# Patient Record
Sex: Male | Born: 1970 | Race: Black or African American | Hispanic: No | Marital: Married | Smoking: Never smoker
Health system: Southern US, Community
[De-identification: ages and names within clinical notes are randomized; demographics above are authoritative.]

## PROBLEM LIST (undated history)

## (undated) DIAGNOSIS — I10 Essential (primary) hypertension: Secondary | ICD-10-CM

## (undated) HISTORY — PX: NO PAST SURGERIES: SHX2092

---

## 2001-05-08 ENCOUNTER — Emergency Department (HOSPITAL_COMMUNITY): Admission: EM | Admit: 2001-05-08 | Discharge: 2001-05-08 | Payer: Self-pay | Admitting: Internal Medicine

## 2001-05-08 ENCOUNTER — Encounter: Payer: Self-pay | Admitting: Internal Medicine

## 2003-09-07 ENCOUNTER — Emergency Department (HOSPITAL_COMMUNITY): Admission: EM | Admit: 2003-09-07 | Discharge: 2003-09-07 | Payer: Self-pay | Admitting: Emergency Medicine

## 2004-02-27 ENCOUNTER — Emergency Department (HOSPITAL_COMMUNITY): Admission: EM | Admit: 2004-02-27 | Discharge: 2004-02-27 | Payer: Self-pay | Admitting: Emergency Medicine

## 2005-07-10 ENCOUNTER — Emergency Department (HOSPITAL_COMMUNITY): Admission: EM | Admit: 2005-07-10 | Discharge: 2005-07-10 | Payer: Self-pay | Admitting: Emergency Medicine

## 2006-01-04 ENCOUNTER — Inpatient Hospital Stay (HOSPITAL_COMMUNITY): Admission: EM | Admit: 2006-01-04 | Discharge: 2006-01-04 | Payer: Self-pay | Admitting: Emergency Medicine

## 2006-08-11 ENCOUNTER — Emergency Department (HOSPITAL_COMMUNITY): Admission: EM | Admit: 2006-08-11 | Discharge: 2006-08-11 | Payer: Self-pay | Admitting: Emergency Medicine

## 2006-12-28 IMAGING — CR DG CHEST 2V
2 series · 2 of 2 positions shown · non-contrast
Comparison: none

CLINICAL DATA: Chest pain.  Lightheaded.  
 CHEST - 2 VIEW:

[view not recorded (1 of 2)]
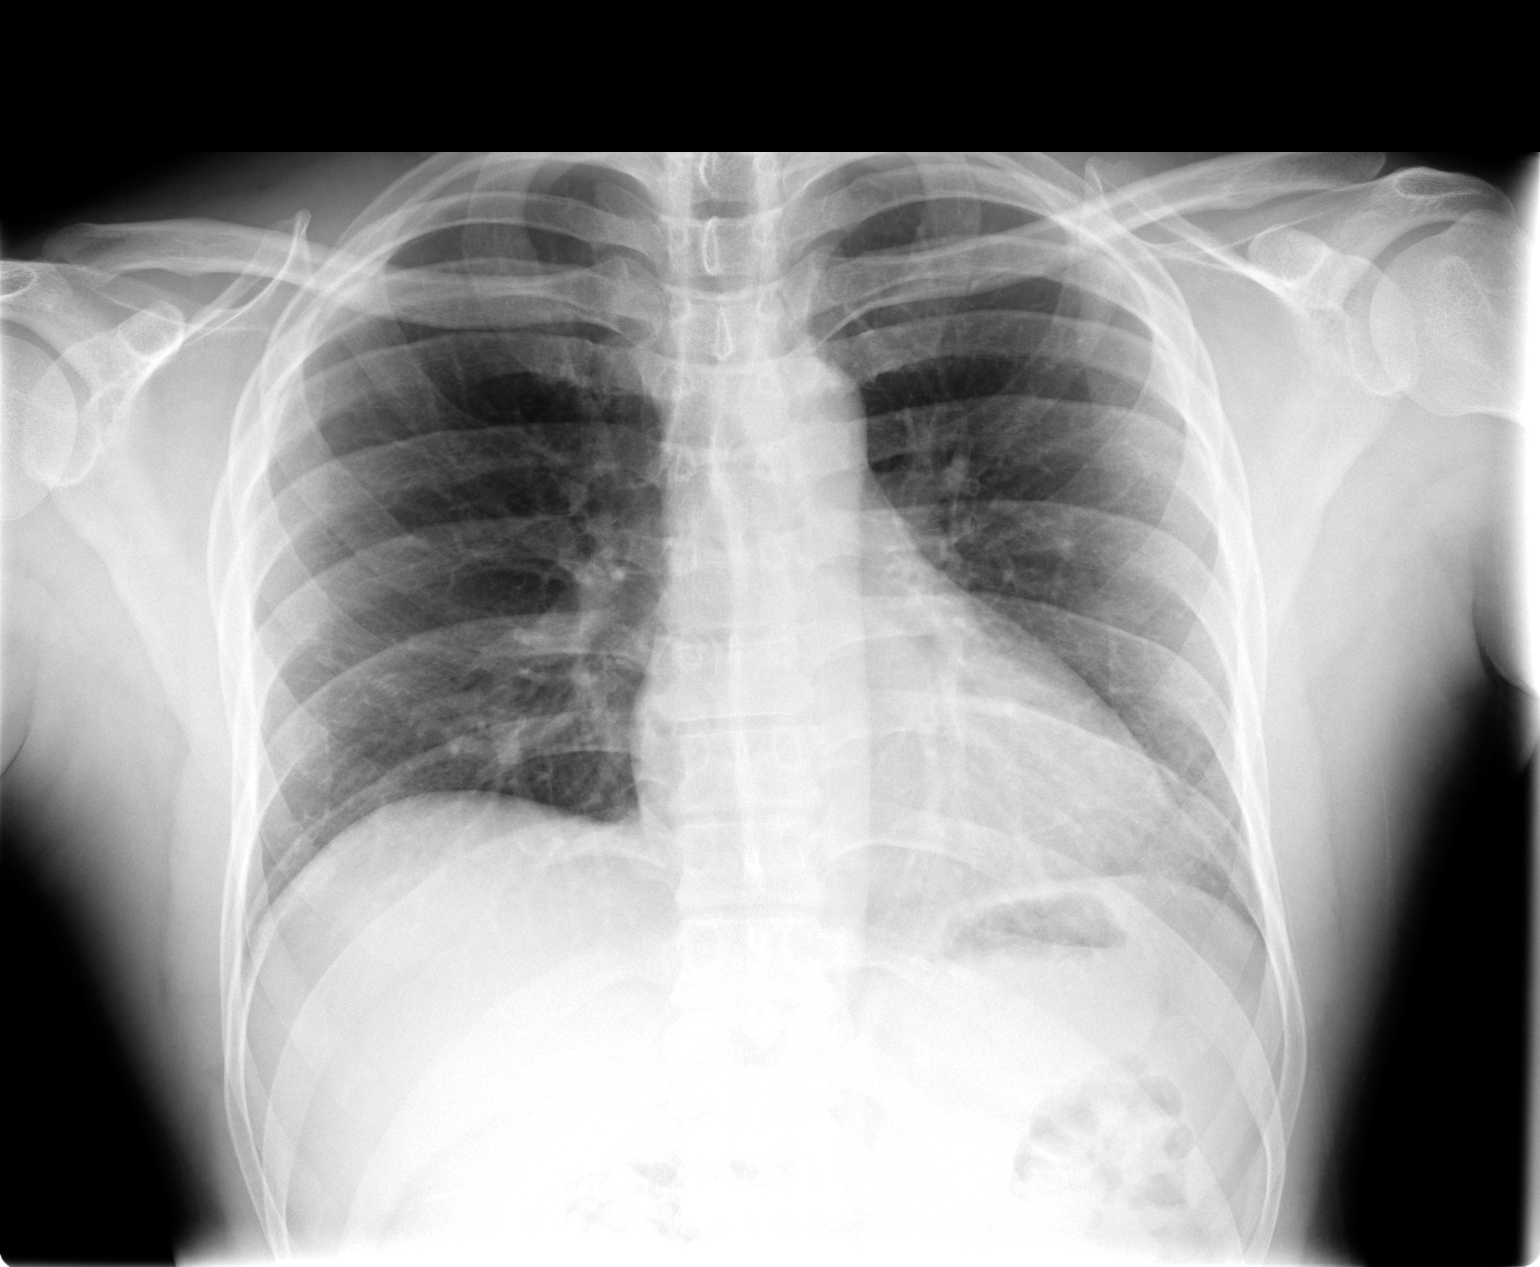

[view not recorded (2 of 2)]
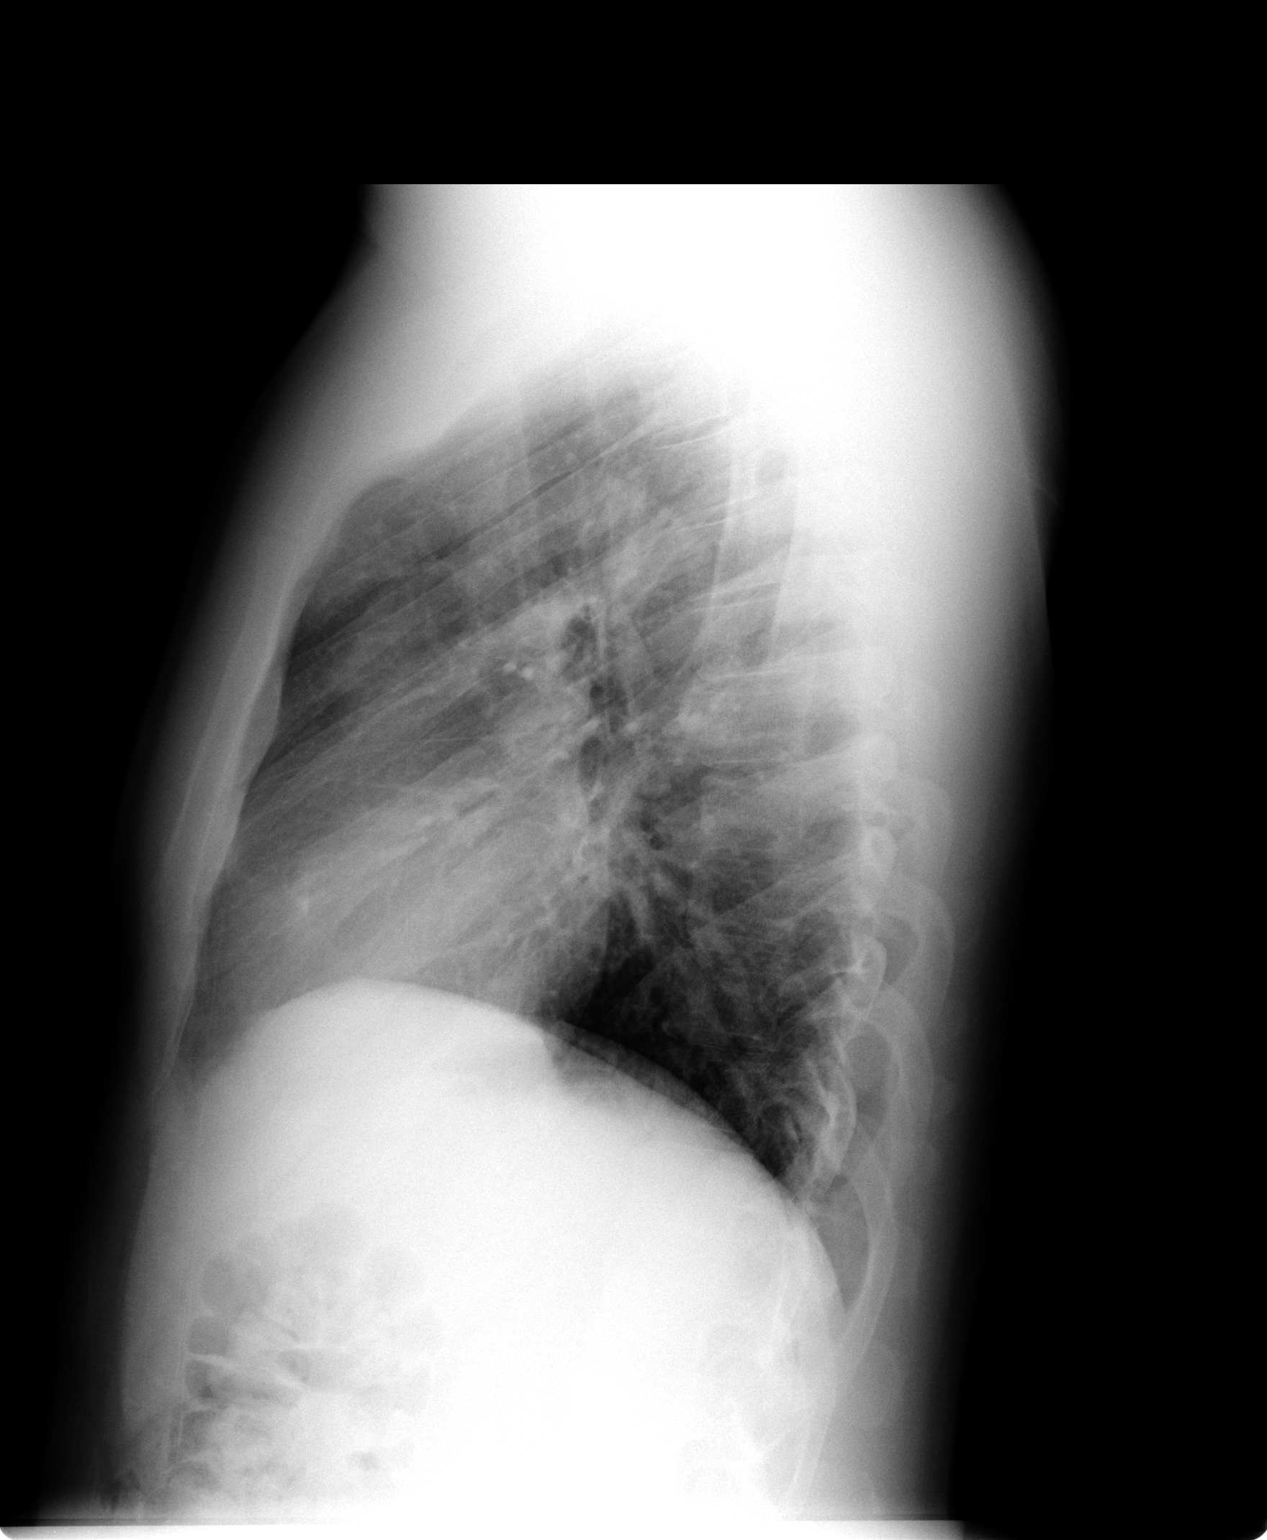

[2 of 2 positions shown; findings below may reference images not displayed]

FINDINGS: PA and lateral views of the chest are made and are compared to the previous studies of 02/27/04 and show mild diffuse peribronchial thickening.  There is no evidence of active infiltrate or edema.  The heart, mediastinum, and bony thorax appear normal and unchanged.
IMPRESSION: Diffuse peribronchial thickening.  Mild basilar atelectasis.  No cardiomegaly, edema, effusion, or pneumothorax.

## 2007-09-10 ENCOUNTER — Emergency Department (HOSPITAL_COMMUNITY): Admission: EM | Admit: 2007-09-10 | Discharge: 2007-09-10 | Payer: Self-pay | Admitting: Emergency Medicine

## 2007-09-14 ENCOUNTER — Emergency Department (HOSPITAL_COMMUNITY): Admission: EM | Admit: 2007-09-14 | Discharge: 2007-09-14 | Payer: Self-pay | Admitting: Emergency Medicine

## 2007-10-29 ENCOUNTER — Emergency Department (HOSPITAL_COMMUNITY): Admission: EM | Admit: 2007-10-29 | Discharge: 2007-10-29 | Payer: Self-pay | Admitting: Emergency Medicine

## 2008-02-06 ENCOUNTER — Emergency Department (HOSPITAL_COMMUNITY): Admission: EM | Admit: 2008-02-06 | Discharge: 2008-02-06 | Payer: Self-pay | Admitting: Emergency Medicine

## 2008-02-06 ENCOUNTER — Encounter: Payer: Self-pay | Admitting: Orthopedic Surgery

## 2008-02-15 ENCOUNTER — Encounter: Payer: Self-pay | Admitting: Orthopedic Surgery

## 2008-02-16 ENCOUNTER — Ambulatory Visit: Payer: Self-pay | Admitting: Orthopedic Surgery

## 2008-02-16 DIAGNOSIS — M76899 Other specified enthesopathies of unspecified lower limb, excluding foot: Secondary | ICD-10-CM | POA: Insufficient documentation

## 2008-07-03 ENCOUNTER — Emergency Department (HOSPITAL_COMMUNITY): Admission: EM | Admit: 2008-07-03 | Discharge: 2008-07-03 | Payer: Self-pay | Admitting: Emergency Medicine

## 2009-07-26 IMAGING — CR DG KNEE COMPLETE 4+V*R*
4 series · 4 of 4 positions shown · non-contrast
Comparison: None

CLINICAL DATA: Pain and swelling

RIGHT KNEE - COMPLETE 4+ VIEW

[view not recorded (1 of 4)]
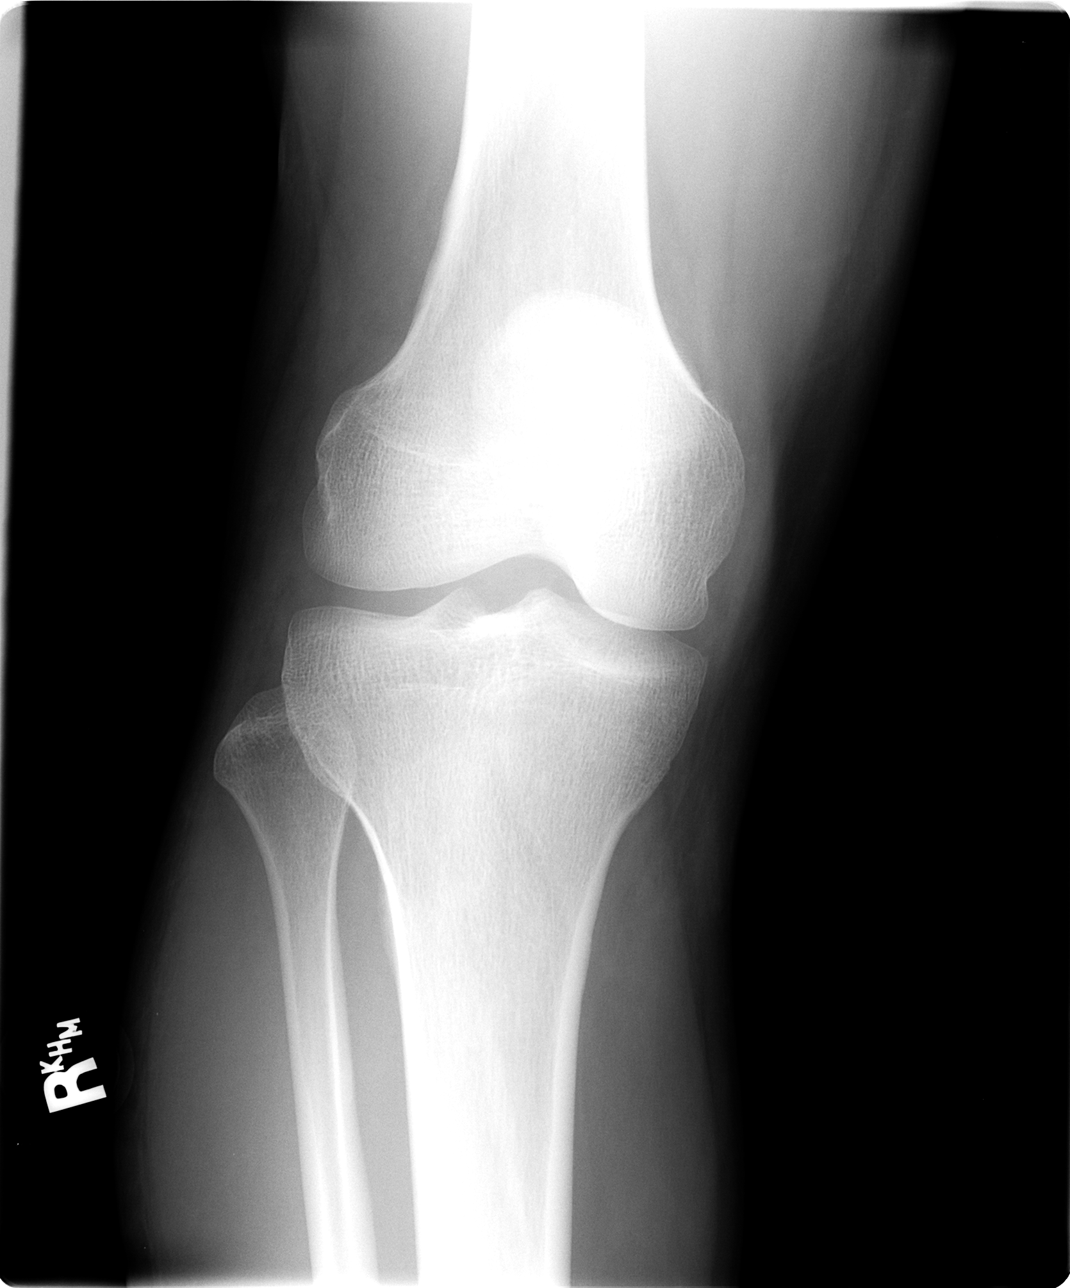

[view not recorded (2 of 4)]
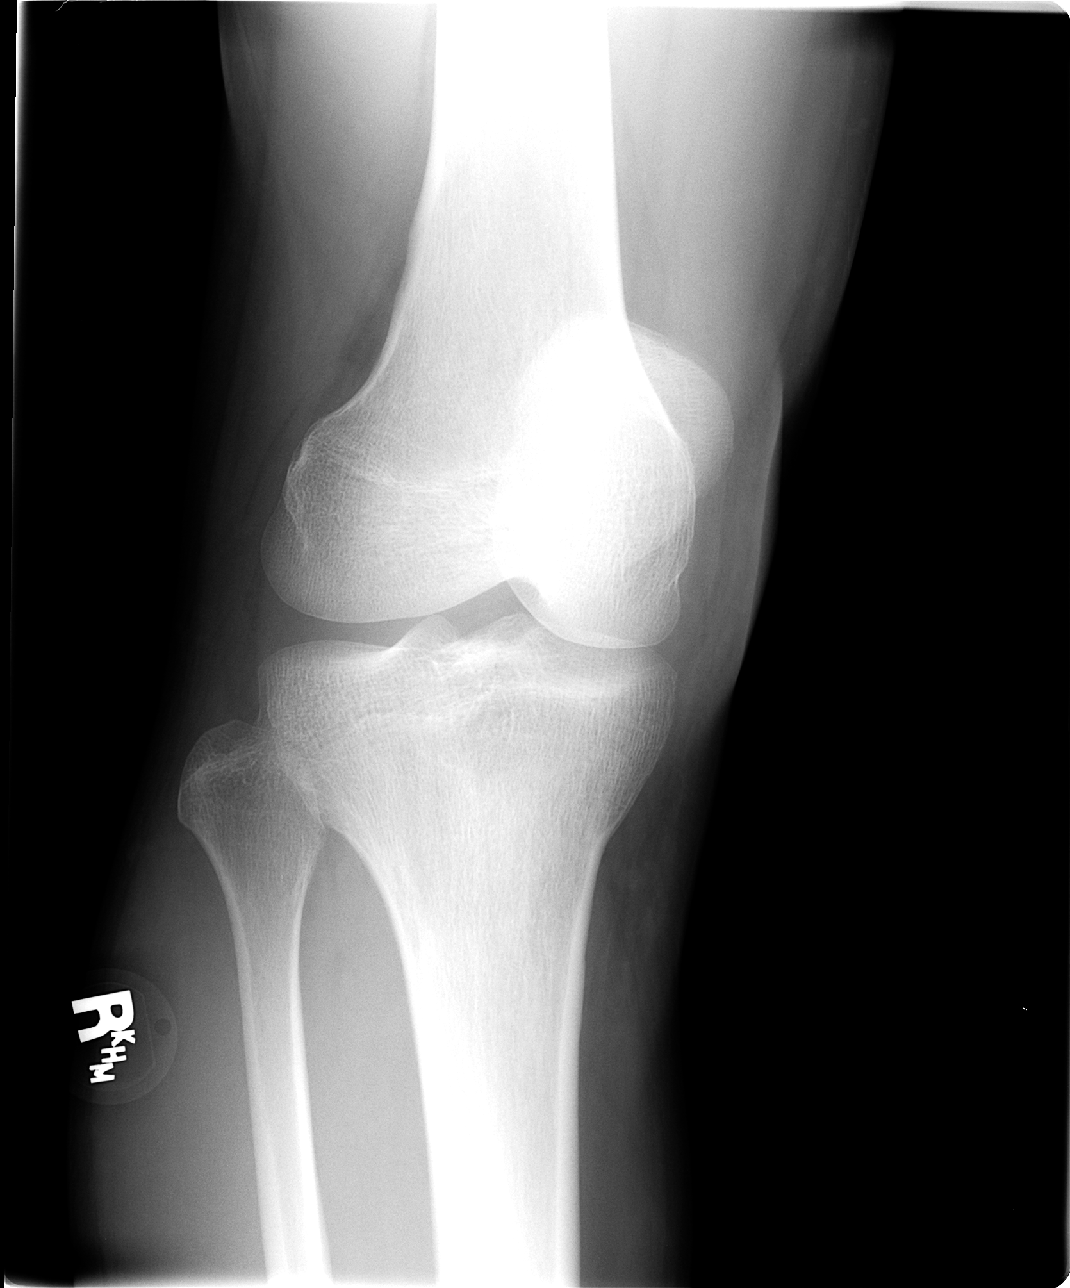

[view not recorded (3 of 4)]
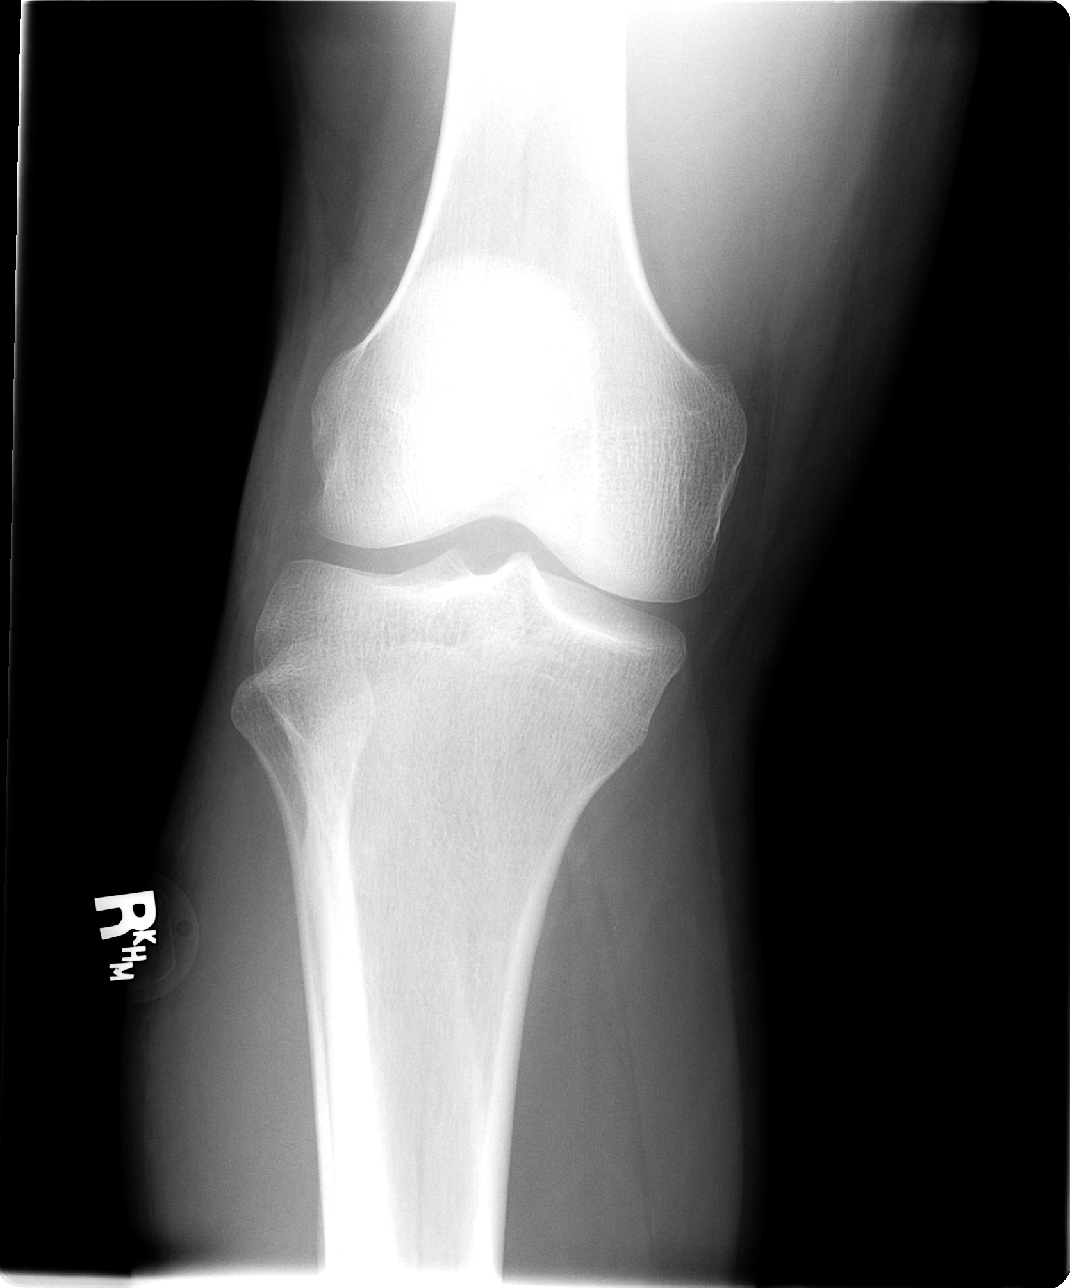

[view not recorded (4 of 4)]
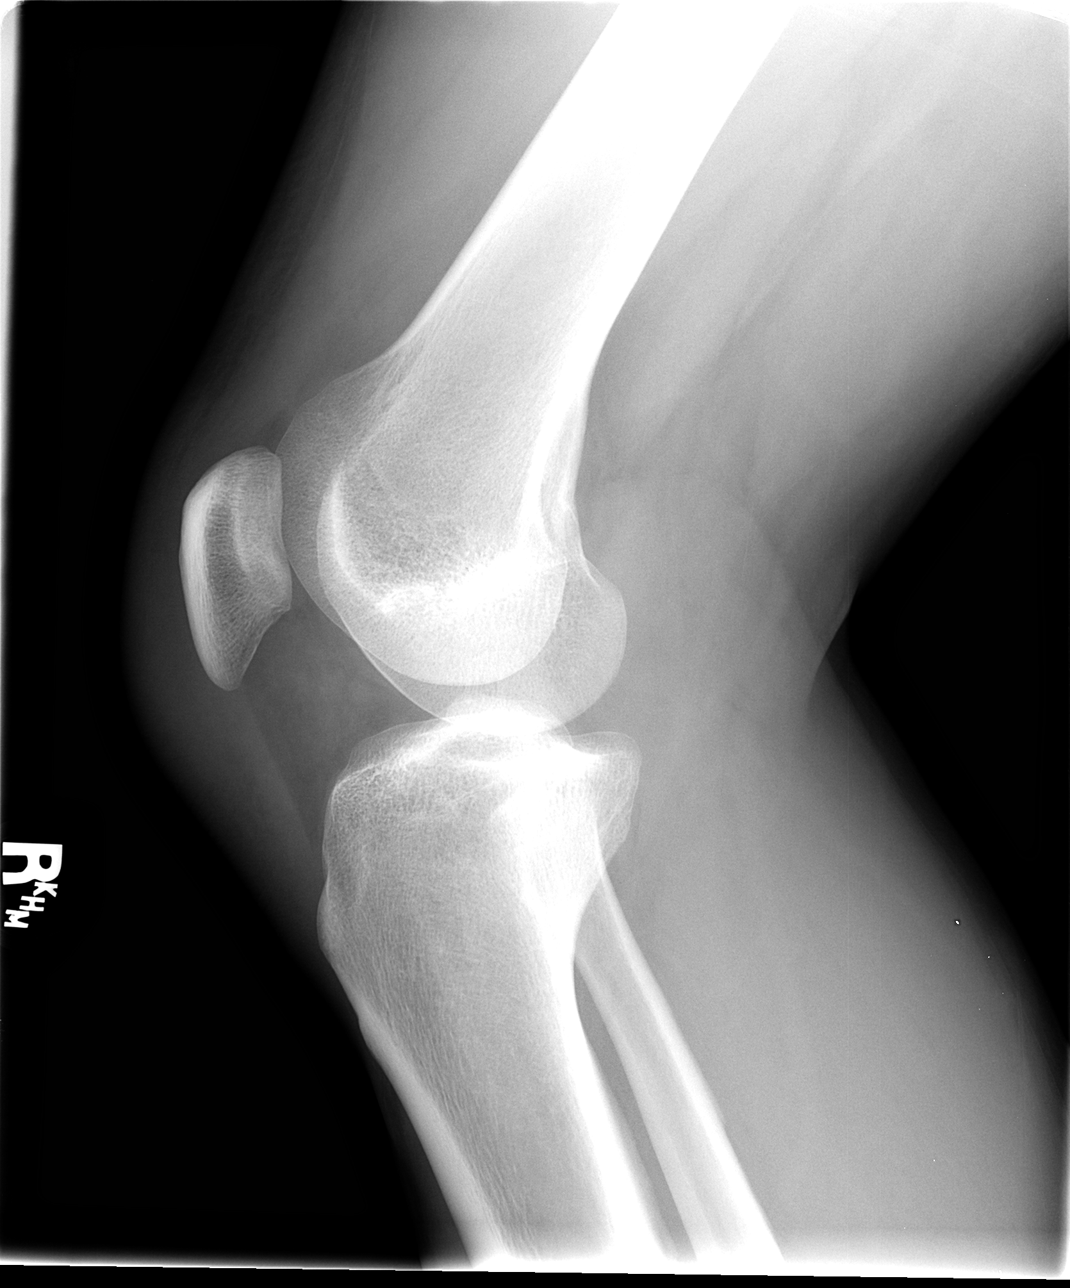

[4 of 4 positions shown; findings below may reference images not displayed]

FINDINGS: There is marked soft tissue swelling anterior to the
patella.There is no evidence of fracture or dislocation.  There is
no evidence of arthropathy or other focal bone abnormality.
IMPRESSION: Marked soft tissue swelling anterior to the patella may indicate
cellulitis.  No underlying osseous abnormality.

## 2010-09-17 LAB — CBC
HCT: 43.3 % (ref 39.0–52.0)
MCHC: 33.7 g/dL (ref 30.0–36.0)
MCV: 93.8 fL (ref 78.0–100.0)
RBC: 4.61 MIL/uL (ref 4.22–5.81)

## 2010-09-17 LAB — BASIC METABOLIC PANEL
BUN: 10 mg/dL (ref 6–23)
CO2: 29 mEq/L (ref 19–32)
Chloride: 100 mEq/L (ref 96–112)
Creatinine, Ser: 1.29 mg/dL (ref 0.4–1.5)
Potassium: 3.4 mEq/L — ABNORMAL LOW (ref 3.5–5.1)

## 2010-09-17 LAB — DIFFERENTIAL
Basophils Relative: 0 % (ref 0–1)
Eosinophils Absolute: 0.2 10*3/uL (ref 0.0–0.7)
Eosinophils Relative: 4 % (ref 0–5)
Monocytes Relative: 11 % (ref 3–12)
Neutrophils Relative %: 56 % (ref 43–77)

## 2010-09-17 LAB — POCT CARDIAC MARKERS: Myoglobin, poc: 64.4 ng/mL (ref 12–200)

## 2010-10-17 ENCOUNTER — Emergency Department (HOSPITAL_COMMUNITY)
Admission: EM | Admit: 2010-10-17 | Discharge: 2010-10-17 | Disposition: A | Discharge status: Home / Self Care | Payer: BLUE CROSS/BLUE SHIELD | Attending: Emergency Medicine | Admitting: Emergency Medicine

## 2010-10-17 DIAGNOSIS — M549 Dorsalgia, unspecified: Secondary | ICD-10-CM | POA: Insufficient documentation

## 2010-10-17 DIAGNOSIS — K219 Gastro-esophageal reflux disease without esophagitis: Secondary | ICD-10-CM | POA: Insufficient documentation

## 2010-10-17 DIAGNOSIS — R109 Unspecified abdominal pain: Secondary | ICD-10-CM | POA: Insufficient documentation

## 2010-10-18 NOTE — Discharge Summary (Signed)
Adam Fernandez, Adam Fernandez                 ACCOUNT NO.:  [id number]   MEDICAL RECORD NO.:  [id number]          PATIENT TYPE:  INP   LOCATION:  ED99                          FACILITY:  [facility]   PHYSICIAN:  Madelin Rear. Sherwood Gambler, MD  DATE OF BIRTH:  April 14, 1971   DATE OF ADMISSION:  01/04/2006  DATE OF DISCHARGE:  08/05/2007[facility]                                 DISCHARGE SUMMARY   DISCHARGE DIAGNOSES:  1. Chest pain noncardiac.  2. Gastroesophageal reflux disease.  3. Possible esophageal spasm.   SUMMARY:  The patient never physically entered the hospital after I visited  him in the emergency room after being contacted in the wee hours of the  morning for same. I advised admission and admission orders were taken;  however, on my arrival in the morning the patient was still in the emergency  department. He was noted to have atypical sounding chest pain with a marked  increase in recent reflux symptoms. His EKG was normal and enzymes were  normal. He was advised to stay; however without an AMA form I allowed him to  leave since I did not feel it was cardiac pain anyway. He had an H. pylori  assess drawn prior to discharge results pending. He will followup in the  office and was placed on proton pump inhibitor i.e. discharge medications  AcipHex 20 mg p.o. b.i.d. Avoid secretogogues, aspirin and other gastric  irritants.      Madelin Rear. Sherwood Gambler, MD  Electronically Signed     LJF/MEDQ  D:  01/08/2006  T:  01/08/2006  Job:  [id number]

## 2012-06-03 ENCOUNTER — Encounter (HOSPITAL_COMMUNITY): Payer: Self-pay | Admitting: *Deleted

## 2012-06-03 ENCOUNTER — Emergency Department (HOSPITAL_COMMUNITY)
Admission: EM | Admit: 2012-06-03 | Discharge: 2012-06-03 | Disposition: A | Discharge status: Home / Self Care | Payer: BC Managed Care – PPO | Attending: Emergency Medicine | Admitting: Emergency Medicine

## 2012-06-03 ENCOUNTER — Emergency Department (HOSPITAL_COMMUNITY): Payer: BC Managed Care – PPO

## 2012-06-03 DIAGNOSIS — R05 Cough: Secondary | ICD-10-CM | POA: Insufficient documentation

## 2012-06-03 DIAGNOSIS — I10 Essential (primary) hypertension: Secondary | ICD-10-CM | POA: Insufficient documentation

## 2012-06-03 DIAGNOSIS — R059 Cough, unspecified: Secondary | ICD-10-CM | POA: Insufficient documentation

## 2012-06-03 DIAGNOSIS — R0789 Other chest pain: Secondary | ICD-10-CM | POA: Insufficient documentation

## 2012-06-03 DIAGNOSIS — Z7982 Long term (current) use of aspirin: Secondary | ICD-10-CM | POA: Insufficient documentation

## 2012-06-03 DIAGNOSIS — R079 Chest pain, unspecified: Secondary | ICD-10-CM

## 2012-06-03 DIAGNOSIS — Z79899 Other long term (current) drug therapy: Secondary | ICD-10-CM | POA: Insufficient documentation

## 2012-06-03 HISTORY — DX: Essential (primary) hypertension: I10

## 2012-06-03 LAB — BASIC METABOLIC PANEL
BUN: 9 mg/dL (ref 6–23)
CO2: 33 mEq/L — ABNORMAL HIGH (ref 19–32)
Calcium: 9.6 mg/dL (ref 8.4–10.5)
Chloride: 95 mEq/L — ABNORMAL LOW (ref 96–112)
Creatinine, Ser: 1.33 mg/dL (ref 0.50–1.35)
GFR calc Af Amer: 75 mL/min — ABNORMAL LOW (ref >90)
GFR calc non Af Amer: 65 mL/min — ABNORMAL LOW (ref >90)
Glucose, Bld: 99 mg/dL (ref 70–99)
Potassium: 3.2 mEq/L — ABNORMAL LOW (ref 3.5–5.1)
Sodium: 135 mEq/L (ref 135–145)

## 2012-06-03 LAB — CBC
HCT: 45.7 % (ref 39.0–52.0)
Hemoglobin: 15.7 g/dL (ref 13.0–17.0)
MCH: 31.2 pg (ref 26.0–34.0)
MCHC: 34.4 g/dL (ref 30.0–36.0)
MCV: 90.7 fL (ref 78.0–100.0)
Platelets: 246 10*3/uL (ref 150–400)
RBC: 5.04 MIL/uL (ref 4.22–5.81)
RDW: 13.5 % (ref 11.5–15.5)
WBC: 4.1 10*3/uL (ref 4.0–10.5)

## 2012-06-03 LAB — TROPONIN I: Troponin I: <0.3 ng/mL (ref <0.30)

## 2012-06-03 MED ORDER — ASPIRIN 81 MG PO CHEW
324.0000 mg | CHEWABLE_TABLET | Freq: Once | ORAL | Status: DC
  Filled 2012-06-03: qty 4

## 2012-06-03 MED ORDER — POTASSIUM CHLORIDE CRYS ER 20 MEQ PO TBCR
40.0000 meq | EXTENDED_RELEASE_TABLET | Freq: Once | ORAL | Status: AC
  Administered 2012-06-03: 40 meq via ORAL
  Filled 2012-06-03: qty 2

## 2012-06-03 NOTE — ED Notes (Signed)
Chest tightness, sinus congestion,  bil ear pain.

## 2012-06-03 NOTE — ED Notes (Signed)
Left in c/o family for transport home; alert, in no distress.  Instructions and f/u information given/reviewed.  Verbalizes understanding.

## 2012-06-04 NOTE — ED Provider Notes (Signed)
History     CSN: [id number]  Arrival date & time 06/03/12  1140   First MD Initiated Contact with Patient 06/03/12 1318      Chief Complaint  Patient presents with  . Chest Pain    (Consider location/radiation/quality/duration/timing/severity/associated sxs/prior treatment) Patient is a 42 y.o. male presenting with chest pain.  Chest Pain The chest pain began yesterday. Chest pain occurs constantly. The chest pain is unchanged. The severity of the pain is mild. The quality of the pain is described as tightness. The pain does not radiate. Primary symptoms include cough. Pertinent negatives for primary symptoms include no fever, no syncope, no shortness of breath, no wheezing, no palpitations and no nausea. He tried nothing for the symptoms.  His past medical history is significant for hypertension.  Pertinent negatives for past medical history include no CAD, no diabetes, no PE and no recent injury.  Procedure history is positive for exercise treadmill test.     Past Medical History  Diagnosis Date  . Hypertension     History reviewed. No pertinent past surgical history.  History reviewed. No pertinent family history.  History  Substance Use Topics  . Smoking status: Never Smoker   . Smokeless tobacco: Not on file  . Alcohol Use: No      Review of Systems  Constitutional: Negative for fever.  Respiratory: Positive for cough. Negative for shortness of breath and wheezing.   Cardiovascular: Positive for chest pain. Negative for palpitations and syncope.  Gastrointestinal: Negative for nausea.    All systems reviewed and negative, other than as noted in HPI.   Allergies  Review of patient's allergies indicates no known allergies.  Home Medications   Current Outpatient Rx  Name  Route  Sig  Dispense  Refill  . ASPIRIN EC 81 MG PO TBEC   Oral   Take 81 mg by mouth daily.         Marland Kitchen HYDROCHLOROTHIAZIDE 25 MG PO TABS   Oral   Take 12.5 mg by mouth daily.         Marland Kitchen METOPROLOL SUCCINATE ER 25 MG PO TB24   Oral   Take 25 mg by mouth daily.         Marland Kitchen ONE-DAILY MULTI VITAMINS PO TABS   Oral   Take 1 tablet by mouth daily.           BP 135/93  Pulse 80  Temp 97.3 F (36.3 C) (Oral)  Resp 20  Ht 5\' 6"  (1.676 m)  Wt 170 lb (77.111 kg)  BMI 27.44 kg/m2  SpO2 99%  Physical Exam  Nursing note and vitals reviewed. Constitutional: He appears well-developed and well-nourished. No distress.  HENT:  Head: Normocephalic and atraumatic.  Eyes: Conjunctivae normal are normal. Right eye exhibits no discharge. Left eye exhibits no discharge.  Neck: Neck supple.  Cardiovascular: Normal rate, regular rhythm and normal heart sounds.  Exam reveals no gallop and no friction rub.   No murmur heard. Pulmonary/Chest: Effort normal and breath sounds normal. No respiratory distress.       Chest pain is not reproducible  Abdominal: Soft. He exhibits no distension. There is no tenderness.  Musculoskeletal: He exhibits no edema and no tenderness.       Lower extremities symmetric as compared to each other. No calf tenderness. Negative Homan's. No palpable cords.   Neurological: He is alert.  Skin: Skin is warm and dry.  Psychiatric: He has a normal mood and affect. His behavior is  normal. Thought content normal.    ED Course  Procedures (including critical care time)  Labs Reviewed  BASIC METABOLIC PANEL - Abnormal; Notable for the following:    Potassium 3.2 (*)     Chloride 95 (*)     CO2 33 (*)     GFR calc non Af Amer 65 (*)     GFR calc Af Amer 75 (*)     All other components within normal limits  CBC  TROPONIN I   Dg Chest 2 View  06/03/2012  *RADIOLOGY REPORT*  Clinical Data: Chest pain following exercise today.  CHEST - 2 VIEW  Comparison: 07/03/2008.  Findings: The heart remains normal in size.  Clear lungs with normal vascularity.  Normal appearing bones.  IMPRESSION: Normal examination.   Original Report Authenticated By: Beckie Salts, M.D.    EKG:  Rhythm: Normal sinus rhythm Vent. rate 76 BPM PR interval 206 ms QRS duration 118 ms QT/QTc 386/434 ms Nonspecific intraventricular delay ST segments: Nonspecific ST changes.   1. Chest pain       MDM  41ym with chest tightness. Possible viral illness with facial pressure and sensation of chest congestion. No respiratory distress. CXR clear. Doubt ACS. Doubt PE. Doubt dissection. Plan symptomatic tx. Return precautions discussed.         Raeford Razor, MD 06/04/12 [id number]

## 2012-08-14 ENCOUNTER — Emergency Department (HOSPITAL_COMMUNITY)
Admission: EM | Admit: 2012-08-14 | Discharge: 2012-08-14 | Disposition: A | Discharge status: Home / Self Care | Payer: BC Managed Care – PPO | Attending: Emergency Medicine | Admitting: Emergency Medicine

## 2012-08-14 ENCOUNTER — Encounter (HOSPITAL_COMMUNITY): Payer: Self-pay | Admitting: *Deleted

## 2012-08-14 DIAGNOSIS — Z79899 Other long term (current) drug therapy: Secondary | ICD-10-CM | POA: Insufficient documentation

## 2012-08-14 DIAGNOSIS — L239 Allergic contact dermatitis, unspecified cause: Secondary | ICD-10-CM

## 2012-08-14 DIAGNOSIS — R21 Rash and other nonspecific skin eruption: Secondary | ICD-10-CM | POA: Insufficient documentation

## 2012-08-14 DIAGNOSIS — Z7982 Long term (current) use of aspirin: Secondary | ICD-10-CM | POA: Insufficient documentation

## 2012-08-14 DIAGNOSIS — I10 Essential (primary) hypertension: Secondary | ICD-10-CM | POA: Insufficient documentation

## 2012-08-14 MED ORDER — PREDNISONE 10 MG PO TABS: ORAL_TABLET | ORAL | Status: DC

## 2012-08-14 MED ORDER — PREDNISONE 50 MG PO TABS
60.0000 mg | ORAL_TABLET | Freq: Once | ORAL | Status: AC
  Administered 2012-08-14: 60 mg via ORAL
  Filled 2012-08-14: qty 1

## 2012-08-14 MED ORDER — PREDNISONE 10 MG PO TABS
ORAL_TABLET | ORAL | Status: AC
  Filled 2012-08-14: qty 1

## 2012-08-14 NOTE — ED Notes (Signed)
Allergic reaction to shower gel x 3 days ago.  Seen PCP and given "shot" and told to take benedryl.  States it's not getting better.  Denies getting worse.  Rash to right eye brow and left chest/abd.  C/o itching.

## 2012-08-15 NOTE — ED Provider Notes (Signed)
History     CSN: [id number]  Arrival date & time 08/14/12  1406   First MD Initiated Contact with Patient 08/14/12 1446      Chief Complaint  Patient presents with  . Allergic Reaction    (Consider location/radiation/quality/duration/timing/severity/associated sxs/prior treatment) HPI Comments: Adam Fernandez is a 42 y.o. Male presenting with a rash which he suspects is reaction either to a new shower gel or new laundry detergent, as he changed both of these the same day (has since changed back).  The rash started 3 days ago and involves itching raised,  Red rash on his right forehead,  Right cheek,  Neck and chest and abdomen.  He was seen by his pcp 3 days ago and obtained a "shot" (he suspects was a steroid shot) and has been taking benadryl without improvement in the symptoms,  Although the rash has not spread further.  There is no drainage from the rash and he denies pain, fever or chills.  The itching is nearly constant.     The history is provided by the patient.    Past Medical History  Diagnosis Date  . Hypertension     History reviewed. No pertinent past surgical history.  No family history on file.  History  Substance Use Topics  . Smoking status: Never Smoker   . Smokeless tobacco: Not on file  . Alcohol Use: No      Review of Systems  Constitutional: Negative for fever and chills.  HENT: Negative for facial swelling.   Respiratory: Negative for shortness of breath and wheezing.   Skin: Positive for rash.  Neurological: Negative for numbness.    Allergies  Review of patient's allergies indicates no known allergies.  Home Medications   Current Outpatient Rx  Name  Route  Sig  Dispense  Refill  . hydrochlorothiazide (HYDRODIURIL) 25 MG tablet   Oral   Take 12.5 mg by mouth daily.         . metoprolol succinate (TOPROL-XL) 25 MG 24 hr tablet   Oral   Take 25 mg by mouth daily.         . Multiple Vitamin (MULTIVITAMIN) tablet   Oral   Take  1 tablet by mouth daily.         Marland Kitchen aspirin EC 81 MG tablet   Oral   Take 81 mg by mouth daily.         . predniSONE (DELTASONE) 10 MG tablet      6, 5, 4, 3, 2 then 1 tablet by mouth daily for 6 days total.   21 tablet   0     BP 111/81  Pulse 89  Temp(Src) 98 F (36.7 C) (Oral)  Resp 20  Ht 5\' 6"  (1.676 m)  Wt 165 lb (74.844 kg)  BMI 26.64 kg/m2  SpO2 100%  Physical Exam  Constitutional: He appears well-developed and well-nourished. No distress.  HENT:  Head: Normocephalic.  Neck: Neck supple.  Cardiovascular: Normal rate.   Pulmonary/Chest: Effort normal. He has no wheezes.  Musculoskeletal: Normal range of motion. He exhibits no edema.  Skin: Rash noted. Rash is maculopapular. Rash is not pustular, not vesicular and not urticarial.  Small patches on right forehead and left cheek.  Larger patch on left chest wall,  Smaller midsternal.  No drainage, pustules,  Positive excoriations.    ED Course  Procedures (including critical care time)  Labs Reviewed - No data to display No results found.   1. Allergic  dermatitis       MDM  Pt encouraged to continue benadryl 50 mg q 6 hours prn itching. He was given a prednisone taper 60-10 over 6 days, given an extra 60 mg dose prior to dc home.  No infection noted in any of the rash sites,  No respiratory compromise,  Simple contact derm.        Burgess Amor, PA-C 08/15/12 [id number]

## 2012-08-16 NOTE — ED Provider Notes (Signed)
Medical screening examination/treatment/procedure(s) were performed by non-physician practitioner and as supervising physician I was immediately available for consultation/collaboration.  Krystelle Prashad, MD 08/16/12 0648 

## 2017-09-08 ENCOUNTER — Encounter: Payer: Self-pay | Admitting: Family Medicine

## 2017-09-08 ENCOUNTER — Other Ambulatory Visit (HOSPITAL_COMMUNITY)
Admission: RE | Admit: 2017-09-08 | Discharge: 2017-09-08 | Disposition: A | Discharge status: Home / Self Care | Payer: BC Managed Care – PPO | Source: Ambulatory Visit | Attending: Family Medicine | Admitting: Family Medicine

## 2017-09-08 ENCOUNTER — Ambulatory Visit: Payer: BC Managed Care – PPO | Admitting: Family Medicine

## 2017-09-08 ENCOUNTER — Other Ambulatory Visit: Payer: Self-pay

## 2017-09-08 VITALS — BP 140/90 | HR 83 | Resp 17 | Ht 67.0 in | Wt 183.0 lb

## 2017-09-08 DIAGNOSIS — J302 Other seasonal allergic rhinitis: Secondary | ICD-10-CM

## 2017-09-08 DIAGNOSIS — R739 Hyperglycemia, unspecified: Secondary | ICD-10-CM | POA: Diagnosis not present

## 2017-09-08 DIAGNOSIS — E782 Mixed hyperlipidemia: Secondary | ICD-10-CM | POA: Diagnosis not present

## 2017-09-08 DIAGNOSIS — I10 Essential (primary) hypertension: Secondary | ICD-10-CM

## 2017-09-08 DIAGNOSIS — Z114 Encounter for screening for human immunodeficiency virus [HIV]: Secondary | ICD-10-CM

## 2017-09-08 DIAGNOSIS — Z113 Encounter for screening for infections with a predominantly sexual mode of transmission: Secondary | ICD-10-CM | POA: Diagnosis present

## 2017-09-08 DIAGNOSIS — Z79899 Other long term (current) drug therapy: Secondary | ICD-10-CM

## 2017-09-08 DIAGNOSIS — Z0184 Encounter for antibody response examination: Secondary | ICD-10-CM

## 2017-09-08 DIAGNOSIS — Z23 Encounter for immunization: Secondary | ICD-10-CM | POA: Diagnosis not present

## 2017-09-08 MED ORDER — LISINOPRIL-HYDROCHLOROTHIAZIDE 10-12.5 MG PO TABS
1.0000 | ORAL_TABLET | Freq: Every day | ORAL | 3 refills | Status: DC
Start: 2017-09-08 — End: 2017-10-09

## 2017-09-08 MED ORDER — LORATADINE 10 MG PO TABS: 10.0000 mg | ORAL_TABLET | Freq: Every day | ORAL | 11 refills | Status: DC

## 2017-09-08 NOTE — Progress Notes (Signed)
Patient ID: Adam Fernandez, male    DOB: December 30, 1970, 47 y.o.   MRN: [id number]  Chief Complaint  Patient presents with  . Establish Care    Allergies Patient has no known allergies.  Subjective:   Adam Fernandez is a 47 y.o. male who presents to [facility] today.  HPI Adam Fernandez presents as a new patient to establish care today.  He was born and raised in [place], [place].  He is married and has 5 children.  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  He reports that he is active and tries to eat a pretty healthy diet.  He has a strong family history of hypertension.  He personally was diagnosed with HTN at age 42.  He reports that he has been on medication for his blood pressure since that time.  He is taking metoprolol XL 25 mg a day and HCTZ 12.5 mg a day.  He reports that his blood pressure is not always controlled.  He reports that it is elevated at times.  He denies any side effects with the medication.  He believes that the HCTZ might make him urinate a little more but it does not bother him.  He denies any chest pain or shortness of breath.  He is active and runs approximately 30 miles a week.  He denies any chest pain, palpitations, or swelling in his extremities.  He reports he has been told he has high cholesterol in the past but is never been placed on any medication.  He reports that for the past approximate 12 months he has had some hypersensitivity in his left testicle area.  He denies any penile discharge.  No skin lesions or sores.  No history of genital herpes.  No dysuria, urinary frequency, urinary hesitancy, or change in his stream of urine.  He has had a prostate exam in the past which was normal.  He denies any history of sexually transmitted infections but is never been checked.  He reports that he has a history of allergies and was given Zyrtec in the past.  He reports he does not like to  take this medication because it makes him feel tired and groggy the next day.  He is also been given a prescription for Flonase but is not sure it works.  He only takes it on an as-needed basis.  He reports that he does get nasal congestion, itchy nose, rhinorrhea and other allergy symptoms associated with pollen.  He has had a history of sinus infection in the past.  No current facial pain or congestion.  He reports he has not been using any over-the-counter decongestants.  He reports that he is happily married.  He does not feel sad, down, or depressed.  He reports that his job can occasionally be stressful but he is overall happy.  He does attend church.  He sleeps well at night.  He has no prior surgical history. 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  He tries to protect himself by wearing gloves.  He reports that he is not sure if he has had his hepatitis vaccine or not.   Past Medical History:  Diagnosis Date  . Hypertension     History reviewed. No pertinent surgical history.  Family History  Problem Relation Age of Onset  . Hypertension Father   .  Cancer Brother        leukemia   . Diabetes Paternal Grandmother      Social History   Socioeconomic History  . Marital status: Married    Spouse name: Not on file  . Number of children: Not on file  . Years of education: Not on file  . Highest education level: Not on file  Occupational History  . Not on file  Social Needs  . Financial resource strain: Not hard at all  . Food insecurity:    Worry: Never true    Inability: Never true  . Transportation needs:    Medical: No    Non-medical: No  Tobacco Use  . Smoking status: Never Smoker  . Smokeless tobacco: Never Used  Substance and Sexual Activity  . Alcohol use: No  . Drug use: No  . Sexual activity: Not on file  Lifestyle  . Physical activity:    Days per week: 7 days    Minutes per  session: Not on file  . Stress: Not on file  Relationships  . Social connections:    Talks on phone: Not on file    Gets together: Not on file    Attends religious service: Not on file    Active member of club or organization: Not on file    Attends meetings of clubs or organizations: Not on file    Relationship status: Not on file  Other Topics Concern  . Not on file  Social History Narrative   Grew up in [place], [place]   Work at [employer], is the basketball and football.    Married with children.   Second marriage.    Has 5 children.    Exercise.    Fitbit challenges.    Eats all foods.   Current Outpatient Medications on File Prior to Visit  Medication Sig Dispense Refill  . aspirin EC 81 MG tablet Take 81 mg by mouth daily.    . Multiple Vitamin (MULTIVITAMIN) tablet Take 1 tablet by mouth daily.     No current facility-administered medications on file prior to visit.     Review of Systems  Constitutional: Negative for appetite change, chills, fever and unexpected weight change.  HENT: Positive for congestion, postnasal drip and rhinorrhea. Negative for dental problem, trouble swallowing and voice change.        Has not been using this Flonase.  Reports he did not know you need to use this on a daily basis.  Has been using Zyrtec but reports it makes him feel very sleepy.  Eyes: Negative for visual disturbance.  Respiratory: Negative for cough, chest tightness, shortness of breath and wheezing.   Cardiovascular: Negative for chest pain, palpitations and leg swelling.  Gastrointestinal: Negative for abdominal pain, diarrhea, nausea and vomiting.  Genitourinary: Negative for decreased urine volume, discharge, dysuria, frequency, hematuria, penile pain, penile swelling, scrotal swelling, testicular pain and urgency.       Denies any testicular pain but reports that he does believe that his left testicle is hypersensitive.  Skin: Negative for rash.  Neurological: Negative  for dizziness, tremors, syncope, facial asymmetry, weakness and headaches.  Hematological: Negative for adenopathy. Does not bruise/bleed easily.  Psychiatric/Behavioral: Negative for behavioral problems, hallucinations and sleep disturbance. The patient is not nervous/anxious and is not hyperactive.      Objective:   BP 140/90   Pulse 83   Resp 17   Ht 5\' 7"  (1.702 m)   Wt 183 lb (83 kg)  SpO2 99%   BMI 28.66 kg/m    Physical Exam  Constitutional: He is oriented to person, place, and time. He appears well-developed and well-nourished.  HENT:  Head: Normocephalic and atraumatic.  Eyes: Pupils are equal, round, and reactive to light. EOM are normal.  Neck: Normal range of motion. Neck supple. No thyromegaly present.  Cardiovascular: Normal rate, regular rhythm and normal heart sounds.  Pulmonary/Chest: Effort normal and breath sounds normal.  Abdominal: Hernia confirmed negative in the right inguinal area and confirmed negative in the left inguinal area.  Genitourinary: Rectum normal, prostate normal, testes normal and penis normal. Right testis shows no mass, no swelling and no tenderness. Left testis shows no mass and no tenderness. No discharge found.  Musculoskeletal: He exhibits no edema.  Lymphadenopathy: No inguinal adenopathy noted on the right side.  Neurological: He is alert and oriented to person, place, and time. No cranial nerve deficit.  Skin: Skin is warm, dry and intact.  Psychiatric: He has a normal mood and affect. His behavior is normal. Thought content normal.  Vitals reviewed.   Depression screen PHQ 2/9 09/08/2017  Decreased Interest 0  Down, Depressed, Hopeless 0  PHQ - 2 Score 0    Assessment and Plan  1. HTN, goal below 140/90 Long discussion with patient regarding his blood pressure and his current medications.  I do believe the patient would benefit from being on ACE inhibitor/HCTZ combination pill.  We did discuss that being African-American and  having a long history of hypertension, that this did increase his risk of renal and cardiovascular disease.  I do not believe that the metoprolol is a great choice for his blood pressure due to the fact that he probably decreases his ability to get to his target heart rate with his exercise.  Due to the fact that he has not been on his metoprolol and is in need of refill at this time will change his medication to lisinopril/HCTZ.  Will discontinue the HCTZ and the metoprolol at this time. Patient counseled in detail regarding the risks of medication. Told to call or return to clinic if develop any worrisome signs or symptoms. Patient voiced understanding.  We will get baseline BMP today and plan to recheck at follow-up.  After obtaining his cholesterol panel we will plan to calculate his 10-year ASCVD risk and initiate aspirin at that time if indicated. - Basic metabolic panel - lisinopril-hydrochlorothiazide (PRINZIDE,ZESTORETIC) 10-12.5 MG tablet; Take 1 tablet by mouth daily.  Dispense: 90 tablet; Refill: 3  2. Seasonal allergies Continue Zyrtec at this time.  Start Claritin as directed.  He does not need a prescription for Flonase at this time.  However we did discuss the appropriate use and daily dosing of this medication. - loratadine (CLARITIN) 10 MG tablet; Take 1 tablet (10 mg total) by mouth daily.  Dispense: 30 tablet; Refill: 11  3. Screening for HIV (human immunodeficiency virus) Screening performed.  4. High risk medication use Check LFTs in anticipation of possibly needing to start statins due to his history. - Hepatic function panel  5. Mixed hyperlipidemia Check lipids today.  Diet, exercise and healthy lifestyle choices recommended. - Lipid panel Lifestyle modifications discussed with patient including a diet emphasizing vegetables, fruits, and whole grains. Limiting intake of sodium to less than 2,400 mg per day.  Recommendations discussed include consuming low-fat dairy  products, poultry, fish, legumes, non-tropical vegetable oils, and nuts; and limiting intake of sweets, sugar-sweetened beverages, and red meat. Discussed following a plan  such as the Dietary Approaches to Stop Hypertension (DASH) diet. Patient to read up on this diet.   6. Hyperglycemia Review of labs does show evidence of hyperglycemia in the past.  We will plan to check hemoglobin A1c. - Hemoglobin A1c  7. Need for Tdap vaccination Patient given. - Tdap vaccine greater than or equal to 7yo IM  8. Immunity status testing Check for immunity to hepatitis B and if not an adequate titer we will plan to initiate due to risk factors associated with his child. - Hepatitis B surface antibody  9. Screen for STD (sexually transmitted disease) Training performed today. 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  No lesions or abnormalities were seen or noted on exam today.  We will plan to check labs as indicated above to rule out any urethritis.  We will discuss again at follow-up and if persistent will consider referral to urologist.  Patient is agreeable with plan.  Office visit was 45 minutes.  Greater than 50% of office visit spent counseling and coordinating care. Return in about 1 month (around 10/06/2017) for BP. Caren Macadam, MD 09/08/2017

## 2017-09-09 ENCOUNTER — Encounter: Payer: Self-pay | Admitting: Family Medicine

## 2017-09-09 LAB — HEPATIC FUNCTION PANEL
AG RATIO: 1.4 (calc) (ref 1.0–2.5)
ALKALINE PHOSPHATASE (APISO): 84 U/L (ref 40–115)
ALT: 21 U/L (ref 9–46)
AST: 24 U/L (ref 10–40)
Albumin: 4.7 g/dL (ref 3.6–5.1)
BILIRUBIN DIRECT: 0.1 mg/dL (ref 0.0–0.2)
BILIRUBIN TOTAL: 0.5 mg/dL (ref 0.2–1.2)
Globulin: 3.3 g/dL (calc) (ref 1.9–3.7)
Indirect Bilirubin: 0.4 mg/dL (calc) (ref 0.2–1.2)
Total Protein: 8 g/dL (ref 6.1–8.1)

## 2017-09-09 LAB — BASIC METABOLIC PANEL
BUN/Creatinine Ratio: 7 (calc) (ref 6–22)
BUN: 10 mg/dL (ref 7–25)
CHLORIDE: 100 mmol/L (ref 98–110)
CO2: 32 mmol/L (ref 20–32)
CREATININE: 1.4 mg/dL — AB (ref 0.60–1.35)
Calcium: 9.9 mg/dL (ref 8.6–10.3)
Glucose, Bld: 106 mg/dL — ABNORMAL HIGH (ref 65–99)
Potassium: 3.9 mmol/L (ref 3.5–5.3)
SODIUM: 137 mmol/L (ref 135–146)

## 2017-09-09 LAB — LIPID PANEL
Cholesterol: 166 mg/dL (ref <200)
HDL: 41 mg/dL (ref >40)
LDL Cholesterol (Calc): 112 mg/dL (calc) — ABNORMAL HIGH
Non-HDL Cholesterol (Calc): 125 mg/dL (calc) (ref <130)
TRIGLYCERIDES: 45 mg/dL (ref <150)
Total CHOL/HDL Ratio: 4 (calc) (ref <5.0)

## 2017-09-09 LAB — URINE CYTOLOGY ANCILLARY ONLY
Chlamydia: NEGATIVE
NEISSERIA GONORRHEA: NEGATIVE
Trichomonas: NEGATIVE

## 2017-09-09 LAB — HIV ANTIBODY (ROUTINE TESTING W REFLEX): HIV: NONREACTIVE

## 2017-09-09 LAB — HEPATITIS PANEL, ACUTE
HEP B C IGM: NONREACTIVE
Hep A IgM: NONREACTIVE
Hepatitis B Surface Ag: NONREACTIVE
Hepatitis C Ab: NONREACTIVE
SIGNAL TO CUT-OFF: 0.05 (ref <1.00)

## 2017-09-09 LAB — HEPATITIS B SURFACE ANTIBODY, QUANTITATIVE

## 2017-09-09 LAB — HEMOGLOBIN A1C
EAG (MMOL/L): 6 (calc)
HEMOGLOBIN A1C: 5.4 %{Hb} (ref <5.7)
MEAN PLASMA GLUCOSE: 108 (calc)

## 2017-09-09 LAB — RPR: RPR: NONREACTIVE

## 2017-09-16 ENCOUNTER — Encounter: Payer: Self-pay | Admitting: Family Medicine

## 2017-10-09 ENCOUNTER — Ambulatory Visit (INDEPENDENT_AMBULATORY_CARE_PROVIDER_SITE_OTHER): Payer: BC Managed Care – PPO | Admitting: Family Medicine

## 2017-10-09 ENCOUNTER — Other Ambulatory Visit: Payer: Self-pay

## 2017-10-09 ENCOUNTER — Encounter: Payer: Self-pay | Admitting: Family Medicine

## 2017-10-09 VITALS — BP 122/80 | HR 94 | Temp 98.5°F | Resp 16 | Ht 66.5 in | Wt 183.1 lb

## 2017-10-09 DIAGNOSIS — I10 Essential (primary) hypertension: Secondary | ICD-10-CM | POA: Diagnosis not present

## 2017-10-09 DIAGNOSIS — Z79899 Other long term (current) drug therapy: Secondary | ICD-10-CM

## 2017-10-09 DIAGNOSIS — Z23 Encounter for immunization: Secondary | ICD-10-CM

## 2017-10-09 LAB — BASIC METABOLIC PANEL
BUN/Creatinine Ratio: 11 (calc) (ref 6–22)
BUN: 15 mg/dL (ref 7–25)
CALCIUM: 9.9 mg/dL (ref 8.6–10.3)
CO2: 31 mmol/L (ref 20–32)
Chloride: 102 mmol/L (ref 98–110)
Creat: 1.38 mg/dL — ABNORMAL HIGH (ref 0.60–1.35)
GLUCOSE: 89 mg/dL (ref 65–99)
Potassium: 4.8 mmol/L (ref 3.5–5.3)
SODIUM: 138 mmol/L (ref 135–146)

## 2017-10-09 MED ORDER — LISINOPRIL-HYDROCHLOROTHIAZIDE 10-12.5 MG PO TABS: 1.0000 | ORAL_TABLET | Freq: Every day | ORAL | 3 refills | Status: AC

## 2017-10-09 NOTE — Patient Instructions (Signed)
Preventing High Cholesterol Cholesterol is a waxy, fat-like substance that your body needs in small amounts. Your liver makes all the cholesterol that your body needs. Having high cholesterol (hypercholesterolemia) increases your risk for heart disease and stroke. Extra (excess) cholesterol comes from the food you eat, such as animal-based fat (saturated fat) from meat and some dairy products. High cholesterol can often be prevented with diet and lifestyle changes. If you already have high cholesterol, you can control it with diet and lifestyle changes, as well as medicine. What nutrition changes can be made?  Eat less saturated fat. Foods that contain saturated fat include red meat and some dairy products.  Avoid processed meats, like bacon and lunch meats.  Avoid trans fats, which are found in margarine and some baked goods.  Avoid foods and beverages that have added sugars.  Eat more fruits, vegetables, and whole grains.  Choose healthy sources of protein, such as fish, poultry, and nuts.  Choose healthy sources of fat, such as: ? Nuts. ? Vegetable oils, especially olive oil. ? Fish that have healthy fats (omega-3 fatty acids), such as mackerel or salmon. What lifestyle changes can be made?  Lose weight if you are overweight. Losing 5-10 lb (2.3-4.5 kg) can help prevent or control high cholesterol and reduce your risk for diabetes and high blood pressure. Ask your health care provider to help you with a diet and exercise plan to safely lose weight.  Get enough exercise. Do at least 150 minutes of moderate-intensity exercise each week. ? You could do this in short exercise sessions several times a day, or you could do longer exercise sessions a few times a week. For example, you could take a brisk 10-minute walk or bike ride, 3 times a day, for 5 days a week.  Do not smoke. If you need help quitting, ask your health care provider.  Limit your alcohol intake. If you drink alcohol,  limit alcohol intake to no more than 1 drink a day for nonpregnant women and 2 drinks a day for men. One drink equals 12 oz of beer, 5 oz of wine, or 1 oz of hard liquor. Why are these changes important? If you have high cholesterol, deposits (plaques) may build up on the walls of your blood vessels. Plaques make the arteries narrower and stiffer, which can restrict or block blood flow and cause blood clots to form. This greatly increases your risk for heart attack and stroke. Making diet and lifestyle changes can reduce your risk for these life-threatening conditions. What can I do to lower my risk?  Manage your risk factors for high cholesterol. Talk with your health care provider about all of your risk factors and how to lower your risk.  Manage other conditions that you have, such as diabetes or high blood pressure (hypertension).  Have your cholesterol checked at regular intervals.  Keep all follow-up visits as told by your health care provider. This is important. How is this treated? In addition to diet and lifestyle changes, your health care provider may recommend medicines to help lower cholesterol, such as a medicine to reduce the amount of cholesterol made in your liver. You may need medicine if:  Diet and lifestyle changes do not lower your cholesterol enough.  You have high cholesterol and other risk factors for heart disease or stroke.  Take over-the-counter and prescription medicines only as told by your health care provider. Where to find more information:  American Heart Association: ThisTune.com.pt.jsp  National Heart, Lung, and  Blood Institute: www.nhlbi.nih.gov/health/resources/heart/heart-cholesterol-hbc-what-html Summary  High cholesterol increases your risk for heart disease and stroke. By keeping your cholesterol level low, you can reduce your risk for these conditions.  Diet and lifestyle changes  are the most important steps in preventing high cholesterol.  Work with your health care provider to manage your risk factors, and have your blood tested regularly. This information is not intended to replace advice given to you by your health care provider. Make sure you discuss any questions you have with your health care provider. Document Released: 06/03/2015 Document Revised: 01/26/2016 Document Reviewed: 01/26/2016 Elsevier Interactive Patient Education  2018 Elsevier Inc.  

## 2017-10-09 NOTE — Progress Notes (Signed)
Patient ID: Adam Fernandez, male    DOB: [birth year], 47 y.o.   MRN: [id number]  Chief Complaint  Patient presents with  . Hypertension    follow up from med change    Allergies Patient has no known allergies.  Subjective:   Adam Fernandez is a 47 y.o. male who presents to [facility] today.  HPI Here for follow-up for his blood pressure.  Reports he has been taking his medication each day.  Denies any side effects.  Reports he feels much better.  Denies any chest pain, shortness of breath, or swelling in his extremities.  Has not had any palpitations.  No side effects with coming off the beta-blocker.  He reports he feels much better with his exercise now that he is off that medication.  He feels like his energy is improved. Would like to go over his labs tests.  Is trying to work on his cholesterol.  Reports his bowel movements are normal.  Has not had any more testicular sensitivity.  Was happy to see that all of his HIV, syphilis, hepatitis testing was negative.  He was surprised to find out that he was not immune to hepatitis B.  He would like to receive this vaccination today.  Reports he has been taking aspirin on a daily basis.   Past Medical History:  Diagnosis Date  . Hypertension     No past surgical history on file.  Family History  Problem Relation Age of Onset  . Hypertension Father   . Cancer Brother        leukemia   . Diabetes Paternal Grandmother      Social History   Socioeconomic History  . Marital status: Married    Spouse name: Not on file  . Number of children: Not on file  . Years of education: Not on file  . Highest education level: Not on file  Occupational History  . Not on file  Social Needs  . Financial resource strain: Not hard at all  . Food insecurity:    Worry: Never true    Inability: Never true  . Transportation needs:    Medical: No    Non-medical: No  Tobacco Use  . Smoking status: Never Smoker  . Smokeless  tobacco: Never Used  Substance and Sexual Activity  . Alcohol use: No  . Drug use: No  . Sexual activity: Not on file  Lifestyle  . Physical activity:    Days per week: 7 days    Minutes per session: Not on file  . Stress: Not on file  Relationships  . Social connections:    Talks on phone: Not on file    Gets together: Not on file    Attends religious service: Not on file    Active member of club or organization: Not on file    Attends meetings of clubs or organizations: Not on file    Relationship status: Not on file  Other Topics Concern  . Not on file  Social History Narrative   Grew up in [place], [place]   Work at [employer], is the basketball and football.    Married with children.   Second marriage.    Has 5 children.    Exercise.    Fitbit challenges.    Eats all foods.   Current Outpatient Medications on File Prior to Visit  Medication Sig Dispense Refill  . loratadine (CLARITIN) 10 MG tablet Take 1 tablet (10 mg  total) by mouth daily. 30 tablet 11  . Multiple Vitamin (MULTIVITAMIN) tablet Take 1 tablet by mouth daily.     No current facility-administered medications on file prior to visit.     Review of Systems   Objective:   BP 122/80 (BP Location: Left Arm, Patient Position: Sitting, Cuff Size: Large)   Pulse 94   Temp 98.5 F (36.9 C) (Oral)   Resp 16   Ht 5' 6.5" (1.689 m)   Wt 183 lb 1.3 oz (83 kg)   SpO2 97%   BMI 29.11 kg/m   Physical Exam  Constitutional: He is oriented to person, place, and time. He appears well-developed and well-nourished.  HENT:  Head: Normocephalic and atraumatic.  Eyes: Pupils are equal, round, and reactive to light. Conjunctivae and EOM are normal. No scleral icterus.  Neck: Normal range of motion. Neck supple. No thyromegaly present.  Cardiovascular: Normal rate, regular rhythm and normal heart sounds.  Pulmonary/Chest: Effort normal and breath sounds normal.  Abdominal: Soft. Bowel sounds are normal.    Musculoskeletal: He exhibits no edema.  Neurological: He is alert and oriented to person, place, and time. No cranial nerve deficit.  Skin: Skin is warm, dry and intact.  Psychiatric: He has a normal mood and affect. His behavior is normal. Thought content normal.  Vitals reviewed.    Assessment and Plan  1. HTN, goal below 140/90 Continue lisinopril HCTZ as directed.  Refill for 3 months at this time.  Check BMP today since initiation of ACE inhibitor. Lifestyle modifications discussed with patient including a diet emphasizing vegetables, fruits, and whole grains. Limiting intake of sodium to less than 2,400 mg per day.  Recommendations discussed include consuming low-fat dairy products, poultry, fish, legumes, non-tropical vegetable oils, and nuts; and limiting intake of sweets, sugar-sweetened beverages, and red meat. Discussed following a plan such as the Dietary Approaches to Stop Hypertension (DASH) diet. Patient to read up on this diet.  Patient counseled in detail regarding the risks of medication. Told to call or return to clinic if develop any worrisome signs or symptoms. Patient voiced understanding.   - Basic metabolic panel - lisinopril-hydrochlorothiazide (PRINZIDE,ZESTORETIC) 10-12.5 MG tablet; Take 1 tablet by mouth daily.  Dispense: 90 tablet; Refill: 3 We will discontinue aspirin at this time.  Calculated his ASCVD risk and need for aspirin.  Risk of aspirin outweighs benefits at this time.  This was based upon his controlled blood pressure reading and his cholesterol values. His 10-year ASCVD risk is calculated at 7%.  He will continue to work on his diet and exercise.  He would like to check his levels again in 3 to 6 months. 2. High risk medication use Check potassium and creatinine today secondary to diuretic and initiating ACE inhibitor.  3. Immunization due Initiate vaccination series today.  We will plan to give next shot at follow-up visit in 2 months. - Hepatitis  A hepatitis B combined vaccine IM She was told to call with any questions or concerns. Return in about 2 months (around 12/09/2017) for follow up. Caren Macadam, MD 10/09/2017

## 2017-10-11 ENCOUNTER — Encounter: Payer: Self-pay | Admitting: Family Medicine

## 2017-11-06 ENCOUNTER — Encounter: Payer: Self-pay | Admitting: Family Medicine

## 2017-11-15 ENCOUNTER — Encounter: Payer: Self-pay | Admitting: Family Medicine

## 2017-12-04 ENCOUNTER — Other Ambulatory Visit: Payer: Self-pay | Admitting: Family Medicine

## 2017-12-04 DIAGNOSIS — J302 Other seasonal allergic rhinitis: Secondary | ICD-10-CM

## 2017-12-10 ENCOUNTER — Ambulatory Visit: Payer: BC Managed Care – PPO | Admitting: Family Medicine

## 2019-06-17 ENCOUNTER — Other Ambulatory Visit: Payer: Self-pay

## 2019-06-17 DIAGNOSIS — Z20822 Contact with and (suspected) exposure to covid-19: Secondary | ICD-10-CM

## 2019-06-19 LAB — NOVEL CORONAVIRUS, NAA: SARS-CoV-2, NAA: NOT DETECTED

## 2019-07-31 ENCOUNTER — Ambulatory Visit: Payer: BC Managed Care – PPO | Attending: Internal Medicine

## 2019-07-31 DIAGNOSIS — Z23 Encounter for immunization: Secondary | ICD-10-CM | POA: Insufficient documentation

## 2019-07-31 NOTE — Progress Notes (Signed)
   Covid-19 Vaccination Clinic  Name:  DALEY ALARIE    MRN: [id number] DOB: 12-16-1970  07/31/2019  Mr. Cumberland was observed post Covid-19 immunization for 15 minutes without incidence. He was provided with Vaccine Information Sheet and instruction to access the V-Safe system.   Mr. Carfagno was instructed to call 911 with any severe reactions post vaccine: Marland Kitchen Difficulty breathing  . Swelling of your face and throat  . A fast heartbeat  . A bad rash all over your body  . Dizziness and weakness    Immunizations Administered    Name Date Dose VIS Date Route   Pfizer COVID-19 Vaccine 07/31/2019  9:00 AM 0.3 mL 05/13/2019 Intramuscular   Manufacturer: [place]   Lot: [id number]   NDC: [id number]

## 2019-08-01 NOTE — Progress Notes (Signed)
Patient received Moderna Vaccine Lot Number [id number] previously documented at [facility]  

## 2019-09-03 ENCOUNTER — Other Ambulatory Visit: Payer: Self-pay

## 2019-09-03 ENCOUNTER — Ambulatory Visit: Payer: BC Managed Care – PPO | Attending: Internal Medicine

## 2019-09-03 DIAGNOSIS — Z23 Encounter for immunization: Secondary | ICD-10-CM

## 2019-09-03 NOTE — Progress Notes (Signed)
   Covid-19 Vaccination Clinic  Name:  ASIF HASEK    MRN: [id number] DOB: 03-23-1971  09/03/2019  Mr. Spang was observed post Covid-19 immunization for 15 minutes without incident. He was provided with Vaccine Information Sheet and instruction to access the V-Safe system.   Mr. Neels was instructed to call 911 with any severe reactions post vaccine: Marland Kitchen Difficulty breathing  . Swelling of face and throat  . A fast heartbeat  . A bad rash all over body  . Dizziness and weakness   Immunizations Administered    Name Date Dose VIS Date Route   Moderna COVID-19 Vaccine 09/03/2019  8:53 AM 0.5 mL 05/03/2019 Intramuscular   Manufacturer: Moderna   Lot: [id number]   [place][id number]

## 2020-02-17 ENCOUNTER — Ambulatory Visit
Admission: EM | Admit: 2020-02-17 | Discharge: 2020-02-17 | Disposition: A | Discharge status: Home / Self Care | Payer: BC Managed Care – PPO | Attending: Emergency Medicine | Admitting: Emergency Medicine

## 2020-02-17 ENCOUNTER — Other Ambulatory Visit: Payer: BC Managed Care – PPO

## 2020-02-17 ENCOUNTER — Other Ambulatory Visit: Payer: Self-pay

## 2020-02-17 DIAGNOSIS — Z20822 Contact with and (suspected) exposure to covid-19: Secondary | ICD-10-CM | POA: Diagnosis not present

## 2020-02-17 NOTE — ED Triage Notes (Signed)
Pt presents with complaints of covid exposure. Denies any symptoms.

## 2020-02-20 LAB — NOVEL CORONAVIRUS, NAA: SARS-CoV-2, NAA: NOT DETECTED

## 2020-04-13 ENCOUNTER — Encounter (INDEPENDENT_AMBULATORY_CARE_PROVIDER_SITE_OTHER): Payer: Self-pay | Admitting: *Deleted

## 2020-08-01 ENCOUNTER — Telehealth (INDEPENDENT_AMBULATORY_CARE_PROVIDER_SITE_OTHER): Payer: Self-pay

## 2020-08-01 ENCOUNTER — Other Ambulatory Visit (INDEPENDENT_AMBULATORY_CARE_PROVIDER_SITE_OTHER): Payer: Self-pay

## 2020-08-01 ENCOUNTER — Encounter (INDEPENDENT_AMBULATORY_CARE_PROVIDER_SITE_OTHER): Payer: Self-pay | Admitting: *Deleted

## 2020-08-01 ENCOUNTER — Encounter (INDEPENDENT_AMBULATORY_CARE_PROVIDER_SITE_OTHER): Payer: Self-pay

## 2020-08-01 DIAGNOSIS — Z1211 Encounter for screening for malignant neoplasm of colon: Secondary | ICD-10-CM

## 2020-08-01 MED ORDER — NA SULFATE-K SULFATE-MG SULF 17.5-3.13-1.6 GM/177ML PO SOLN: 1.0000 | Freq: Once | ORAL | 0 refills | Status: AC

## 2020-08-01 NOTE — Telephone Encounter (Signed)
Referring MD/PCP: Fusco   Procedure: TCS    Reason/Indication:  screening  Has patient had this procedure before?  No  If so, when, by whom and where?    Is there a family history of colon cancer?  No  Who?  What age when diagnosed?    Is patient diabetic?   No      Does patient have prosthetic heart valve or mechanical valve?  No  Do you have a pacemaker/defibrillator?  No  Has patient ever had endocarditis/atrial fibrillation? No  Have you had a stroke/heart attack last 6 mths? No  Does patient use oxygen? No  Has patient had joint replacement within last 12 months?  No  Is patient constipated or do they take laxatives? No  Does patient have a history of alcohol/drug use?  No  Is patient on blood thinner such as Coumadin, Plavix and/or Aspirin? No  Do you take medicine for weight loss. No  Medications: Fluticasone 50 mcg daily,Lisinopril/hct10/12.5 mg daily,famotidine 20 mg daily,benzonatate 100 mg daily  Allergies: NKDA  Medication Adjustment per Dr Rehman/Dr Jenetta Downer No  Procedure date & time: 08/14/2020 AM

## 2020-08-01 NOTE — Telephone Encounter (Signed)
Ok to schedule.  Thanks,  Jeriah Corkum Castaneda Mayorga, MD Gastroenterology and Hepatology [facility]  

## 2020-08-09 ENCOUNTER — Encounter (HOSPITAL_COMMUNITY): Payer: Self-pay | Admitting: Gastroenterology

## 2020-08-13 ENCOUNTER — Other Ambulatory Visit (HOSPITAL_COMMUNITY)
Admission: RE | Admit: 2020-08-13 | Discharge: 2020-08-13 | Disposition: A | Discharge status: Home / Self Care | Payer: BC Managed Care – PPO | Source: Ambulatory Visit | Attending: Gastroenterology | Admitting: Gastroenterology

## 2020-08-13 ENCOUNTER — Other Ambulatory Visit: Payer: Self-pay

## 2020-08-13 DIAGNOSIS — Z8249 Family history of ischemic heart disease and other diseases of the circulatory system: Secondary | ICD-10-CM | POA: Diagnosis not present

## 2020-08-13 DIAGNOSIS — D123 Benign neoplasm of transverse colon: Secondary | ICD-10-CM | POA: Diagnosis not present

## 2020-08-13 DIAGNOSIS — Z01812 Encounter for preprocedural laboratory examination: Secondary | ICD-10-CM | POA: Insufficient documentation

## 2020-08-13 DIAGNOSIS — Z806 Family history of leukemia: Secondary | ICD-10-CM | POA: Diagnosis not present

## 2020-08-13 DIAGNOSIS — Z1211 Encounter for screening for malignant neoplasm of colon: Secondary | ICD-10-CM | POA: Diagnosis present

## 2020-08-13 DIAGNOSIS — Z79899 Other long term (current) drug therapy: Secondary | ICD-10-CM | POA: Diagnosis not present

## 2020-08-13 DIAGNOSIS — K5730 Diverticulosis of large intestine without perforation or abscess without bleeding: Secondary | ICD-10-CM | POA: Diagnosis not present

## 2020-08-13 DIAGNOSIS — Z20822 Contact with and (suspected) exposure to covid-19: Secondary | ICD-10-CM | POA: Insufficient documentation

## 2020-08-13 DIAGNOSIS — I10 Essential (primary) hypertension: Secondary | ICD-10-CM | POA: Diagnosis not present

## 2020-08-13 LAB — BASIC METABOLIC PANEL
Anion gap: 9 (ref 5–15)
BUN: 8 mg/dL (ref 6–20)
CO2: 28 mmol/L (ref 22–32)
Calcium: 9.5 mg/dL (ref 8.9–10.3)
Chloride: 101 mmol/L (ref 98–111)
Creatinine, Ser: 1.41 mg/dL — ABNORMAL HIGH (ref 0.61–1.24)
GFR, Estimated: >60 mL/min (ref >60)
Glucose, Bld: 105 mg/dL — ABNORMAL HIGH (ref 70–99)
Potassium: 3.7 mmol/L (ref 3.5–5.1)
Sodium: 138 mmol/L (ref 135–145)

## 2020-08-13 LAB — SARS CORONAVIRUS 2 (TAT 6-24 HRS): SARS Coronavirus 2: NEGATIVE

## 2020-08-14 ENCOUNTER — Other Ambulatory Visit: Payer: Self-pay

## 2020-08-14 ENCOUNTER — Encounter (HOSPITAL_COMMUNITY): Payer: Self-pay | Admitting: Gastroenterology

## 2020-08-14 ENCOUNTER — Ambulatory Visit (HOSPITAL_COMMUNITY): Payer: BC Managed Care – PPO | Admitting: Anesthesiology

## 2020-08-14 ENCOUNTER — Ambulatory Visit (HOSPITAL_COMMUNITY)
Admission: RE | Admit: 2020-08-14 | Discharge: 2020-08-14 | Disposition: A | Discharge status: Home / Self Care | Payer: BC Managed Care – PPO | Attending: Gastroenterology | Admitting: Gastroenterology

## 2020-08-14 ENCOUNTER — Encounter (HOSPITAL_COMMUNITY)
Admission: RE | Disposition: A | Discharge status: Home / Self Care | Payer: Self-pay | Source: Home / Self Care | Attending: Gastroenterology

## 2020-08-14 DIAGNOSIS — K5730 Diverticulosis of large intestine without perforation or abscess without bleeding: Secondary | ICD-10-CM | POA: Diagnosis not present

## 2020-08-14 DIAGNOSIS — D123 Benign neoplasm of transverse colon: Secondary | ICD-10-CM

## 2020-08-14 DIAGNOSIS — D122 Benign neoplasm of ascending colon: Secondary | ICD-10-CM | POA: Diagnosis not present

## 2020-08-14 DIAGNOSIS — Z1211 Encounter for screening for malignant neoplasm of colon: Secondary | ICD-10-CM | POA: Diagnosis not present

## 2020-08-14 DIAGNOSIS — Z79899 Other long term (current) drug therapy: Secondary | ICD-10-CM | POA: Insufficient documentation

## 2020-08-14 DIAGNOSIS — Z20822 Contact with and (suspected) exposure to covid-19: Secondary | ICD-10-CM | POA: Insufficient documentation

## 2020-08-14 DIAGNOSIS — Z806 Family history of leukemia: Secondary | ICD-10-CM | POA: Insufficient documentation

## 2020-08-14 DIAGNOSIS — Z8249 Family history of ischemic heart disease and other diseases of the circulatory system: Secondary | ICD-10-CM | POA: Insufficient documentation

## 2020-08-14 DIAGNOSIS — I10 Essential (primary) hypertension: Secondary | ICD-10-CM | POA: Insufficient documentation

## 2020-08-14 HISTORY — PX: POLYPECTOMY: SHX5525

## 2020-08-14 HISTORY — PX: COLONOSCOPY WITH PROPOFOL: SHX5780

## 2020-08-14 LAB — HM COLONOSCOPY

## 2020-08-14 SURGERY — COLONOSCOPY WITH PROPOFOL
Anesthesia: General

## 2020-08-14 MED ORDER — CHLORHEXIDINE GLUCONATE CLOTH 2 % EX PADS: 6.0000 | MEDICATED_PAD | Freq: Once | CUTANEOUS | Status: DC

## 2020-08-14 MED ORDER — LIDOCAINE HCL (CARDIAC) PF 100 MG/5ML IV SOSY
PREFILLED_SYRINGE | INTRAVENOUS | Status: DC | PRN
  Administered 2020-08-14: 50 mg via INTRATRACHEAL

## 2020-08-14 MED ORDER — EPINEPHRINE 1 MG/10ML IJ SOSY
PREFILLED_SYRINGE | INTRAMUSCULAR | Status: AC
  Filled 2020-08-14: qty 10

## 2020-08-14 MED ORDER — PROPOFOL 10 MG/ML IV BOLUS
INTRAVENOUS | Status: DC | PRN
  Administered 2020-08-14 (×3): 50 mg via INTRAVENOUS
  Administered 2020-08-14: 100 mg via INTRAVENOUS

## 2020-08-14 MED ORDER — LACTATED RINGERS IV SOLN
INTRAVENOUS | Status: DC
  Administered 2020-08-14: 1000 mL via INTRAVENOUS

## 2020-08-14 MED ORDER — PROPOFOL 10 MG/ML IV BOLUS
INTRAVENOUS | Status: AC
  Filled 2020-08-14: qty 40

## 2020-08-14 NOTE — Transfer of Care (Signed)
Immediate Anesthesia Transfer of Care Note  Patient: Adam Fernandez Blatter  Procedure(s) Performed: COLONOSCOPY WITH PROPOFOL (N/A ) POLYPECTOMY  Patient Location: Endoscopy Unit  Anesthesia Type:General  Level of Consciousness: awake, alert , oriented and patient cooperative  Airway & Oxygen Therapy: Patient Spontanous Breathing  Post-op Assessment: Report given to RN and Post -op Vital signs reviewed and stable  Post vital signs: Reviewed and stable  Last Vitals:  Vitals Value Taken Time  BP    Temp    Pulse    Resp    SpO2      Last Pain:  Vitals:   08/14/20 1047  TempSrc:   PainSc: 0-No pain      Patients Stated Pain Goal: 8 (90/30/09 2330)  Complications: No complications documented.

## 2020-08-14 NOTE — H&P (Signed)
Adam Fernandez is an 50 y.o. male.   Chief Complaint: screening colonoscopy HPI: 50 year old male with medical history of hypertension, coming for screening colonoscopy. The patient has never had a colonoscopy in the past.  The patient denies having any complaints such as melena, hematochezia, abdominal pain or distention, change in her bowel movement consistency or frequency, no changes in her weight recently.  No family history of colorectal cancer.   Past Medical History:  Diagnosis Date  . Hypertension     Past Surgical History:  Procedure Laterality Date  . NO PAST SURGERIES      Family History  Problem Relation Age of Onset  . Hypertension Father   . Cancer Brother        leukemia   . Diabetes Paternal Grandmother    Social History:  reports that he has never smoked. He has never used smokeless tobacco. He reports that he does not drink alcohol and does not use drugs.  Allergies: No Known Allergies  Medications Prior to Admission  Medication Sig Dispense Refill  . acetaminophen (TYLENOL) 500 MG tablet Take 500-1,000 mg by mouth every 6 (six) hours as needed (pain).    . Ascorbic Acid (VITAMIN C PO) Take 1 tablet by mouth daily in the afternoon.    . Black Currant Seed Oil 500 MG CAPS Take 500 mg by mouth daily.    . Cholecalciferol (VITAMIN D3 PO) Take 2 tablets by mouth daily in the afternoon.    . Cinnamon 500 MG TABS Take 500 mg by mouth daily.    . famotidine (PEPCID) 20 MG tablet Take 20 mg by mouth at bedtime.    . fluticasone (FLONASE) 50 MCG/ACT nasal spray Place 2 sprays into both nostrils daily.    Marland Kitchen GARLIC PO Take 1 capsule by mouth 2 (two) times a week.    Marland Kitchen lisinopril-hydrochlorothiazide (PRINZIDE,ZESTORETIC) 10-12.5 MG tablet Take 1 tablet by mouth daily. 90 tablet 3  . loratadine (CLARITIN) 10 MG tablet TAKE 1 TABLET BY MOUTH EVERY DAY (Patient taking differently: Take 10 mg by mouth daily as needed for allergies.) 30 tablet 4  . Multiple Vitamin  (MULTIVITAMIN WITH MINERALS) TABS tablet Take 1 tablet by mouth daily.    . Multiple Vitamins-Minerals (ZINC PO) Take 1 tablet by mouth daily in the afternoon.    . Omega-3 Fatty Acids (FISH OIL) 500 MG CAPS Take 500 mg by mouth every evening.      Results for orders placed or performed during the hospital encounter of 08/13/20 (from the past 48 hour(s))  Basic metabolic panel     Status: Abnormal   Collection Time: 08/13/20  8:20 AM  Result Value Ref Range   Sodium 138 135 - 145 mmol/L   Potassium 3.7 3.5 - 5.1 mmol/L   Chloride 101 98 - 111 mmol/L   CO2 28 22 - 32 mmol/L   Glucose, Bld 105 (H) 70 - 99 mg/dL    Comment: Glucose reference range applies only to samples taken after fasting for at least 8 hours.   BUN 8 6 - 20 mg/dL   Creatinine, Ser 1.41 (H) 0.61 - 1.24 mg/dL   Calcium 9.5 8.9 - 10.3 mg/dL   GFR, Estimated >60 >60 mL/min    Comment: (NOTE) Calculated using the CKD-EPI Creatinine Equation (2021)    Anion gap 9 5 - 15    Comment: Performed at [facility], [address], [place],  [address]  SARS CORONAVIRUS 2 (TAT 6-24 HRS) Nasopharyngeal Nasopharyngeal Swab  Status: None   Collection Time: 08/13/20  8:44 AM   Specimen: Nasopharyngeal Swab  Result Value Ref Range   SARS Coronavirus 2 NEGATIVE NEGATIVE    Comment: (NOTE) SARS-CoV-2 target nucleic acids are NOT DETECTED.  The SARS-CoV-2 RNA is generally detectable in upper and lower respiratory specimens during the acute phase of infection. Negative results do not preclude SARS-CoV-2 infection, do not rule out co-infections with other pathogens, and should not be used as the sole basis for treatment or other patient management decisions. Negative results must be combined with clinical observations, patient history, and epidemiological information. The expected result is Negative.  Fact Sheet for Patients: SugarRoll.be  Fact Sheet for Healthcare  Providers: https://www.woods-mathews.com/  This test is not yet approved or cleared by the [place] FDA and  has been authorized for detection and/or diagnosis of SARS-CoV-2 by FDA under an Emergency Use Authorization (EUA). This EUA will remain  in effect (meaning this test can be used) for the duration of the COVID-19 declaration under Se ction 564(b)(1) of the Act, 21 U.S.C. section 360bbb-3(b)(1), unless the authorization is terminated or revoked sooner.  Performed at [facility], [place] [address], [place], [place] [address]    No results found.  Review of Systems  Constitutional: Negative.   HENT: Negative.   Eyes: Negative.   Respiratory: Negative.   Cardiovascular: Negative.   Gastrointestinal: Negative.   Endocrine: Negative.   Genitourinary: Negative.   Musculoskeletal: Negative.   Skin: Negative.   Allergic/Immunologic: Negative.   Neurological: Negative.   Hematological: Negative.   Psychiatric/Behavioral: Negative.     Blood pressure (!) 123/92, pulse 100, temperature 98.2 F (36.8 C), temperature source Oral, resp. rate 12, height 5\' 6"  (1.676 m), weight 79.4 kg, SpO2 98 %. Physical Exam  GENERAL: The patient is AO x3, in no acute distress. HEENT: Head is normocephalic and atraumatic. EOMI are intact. Mouth is well hydrated and without lesions. NECK: Supple. No masses LUNGS: Clear to auscultation. No presence of rhonchi/wheezing/rales. Adequate chest expansion HEART: RRR, normal s1 and s2. ABDOMEN: Soft, nontender, no guarding, no peritoneal signs, and nondistended. BS +. No masses. EXTREMITIES: Without any cyanosis, clubbing, rash, lesions or edema. NEUROLOGIC: AOx3, no focal motor deficit. SKIN: no jaundice, no rashes  Assessment/Plan 50 year old male with medical history of hypertension, coming for screening colonoscopy. The patient has never had a colonoscopy in the past.  The patient is at average risk for colorectal cancer.  We  will proceed with colonoscopy today.   Harvel Quale, MD 08/14/2020, 10:38 AM

## 2020-08-14 NOTE — Op Note (Signed)
[facility] Patient Name: Adam Fernandez Procedure Date: 08/14/2020 10:35 AM MRN: [id number] Date of Birth: 1970/09/09 Attending MD: Maylon Peppers ,  CSN: [id number] Age: 50 Admit Type: Outpatient Procedure:                Colonoscopy Indications:              Screening for colorectal malignant neoplasm Providers:                Maylon Peppers, [place] Sharon Seller, RN, Dereck Leep, Technician Referring MD:              Medicines:                Monitored Anesthesia Care Complications:            No immediate complications. Estimated Blood Loss:     Estimated blood loss: none. Procedure:                Pre-Anesthesia Assessment:                           - Prior to the procedure, a History and Physical                            was performed, and patient medications, allergies                            and sensitivities were reviewed. The patient's                            tolerance of previous anesthesia was reviewed.                           - The risks and benefits of the procedure and the                            sedation options and risks were discussed with the                            patient. All questions were answered and informed                            consent was obtained.                           - ASA Grade Assessment: II - A patient with mild                            systemic disease.                           After obtaining informed consent, the colonoscope                            was passed under direct vision. Throughout the  procedure, the patient's blood pressure, pulse, and                            oxygen saturations were monitored continuously. The                            [id number]-HQ190L ([id number]) scope was introduced through                            the anus and advanced to the the cecum, identified                            by appendiceal orifice and ileocecal valve.  The                            colonoscopy was performed without difficulty. The                            patient tolerated the procedure well. The quality                            of the bowel preparation was good. Scope withdrawal                            time was 12 minutes. Scope In: 10:54:54 AM Scope Out: 08:14:48 AM Scope Withdrawal Time: 0 hours 15 minutes 47 seconds  Total Procedure Duration: 0 hours 21 minutes 50 seconds  Findings:      The perianal and digital rectal examinations were normal.      Two sessile polyps were found in the transverse colon and ascending       colon. The polyps were 3 to 5 mm in size. These polyps were removed with       a cold snare. Resection and retrieval were complete.      A few small-mouthed diverticula were found in the sigmoid colon.      The retroflexed view of the distal rectum and anal verge was normal and       showed no anal or rectal abnormalities. Impression:               - Two 3 to 5 mm polyps in the transverse colon and                            in the ascending colon, removed with a cold snare.                            Resected and retrieved.                           - Diverticulosis in the sigmoid colon.                           - The distal rectum and anal verge are normal on  retroflexion view. Moderate Sedation:      Per Anesthesia Care Recommendation:           - Discharge patient to home (ambulatory).                           - Resume previous diet.                           - Await pathology results.                           - Repeat colonoscopy for surveillance based on                            pathology results. Procedure Code(s):        --- Professional ---                           [phone], Colonoscopy, flexible; with removal of                            tumor(s), polyp(s), or other lesion(s) by snare                            technique Diagnosis Code(s):        ---  Professional ---                           Z12.11, Encounter for screening for malignant                            neoplasm of colon                           K63.5, Polyp of colon                           K57.30, Diverticulosis of large intestine without                            perforation or abscess without bleeding CPT copyright 2019 American Medical Association. All rights reserved. The codes documented in this report are preliminary and upon coder review may  be revised to meet current compliance requirements. Maylon Peppers, MD Maylon Peppers,  08/14/2020 11:19:49 AM This report has been signed electronically. Number of Addenda: 0

## 2020-08-14 NOTE — Anesthesia Postprocedure Evaluation (Signed)
Anesthesia Post Note  Patient: Adam Fernandez  Procedure(s) Performed: COLONOSCOPY WITH PROPOFOL (N/A ) POLYPECTOMY  Patient location during evaluation: Endoscopy Anesthesia Type: General Level of consciousness: awake and alert and oriented Pain management: pain level controlled Vital Signs Assessment: post-procedure vital signs reviewed and stable Respiratory status: spontaneous breathing, nonlabored ventilation and respiratory function stable Cardiovascular status: blood pressure returned to baseline Postop Assessment: no apparent nausea or vomiting Anesthetic complications: no   No complications documented.   Last Vitals:  Vitals:   08/14/20 1017  BP: (!) 123/92  Pulse: 100  Resp: 12  Temp: 36.8 C  SpO2: 98%    Last Pain:  Vitals:   08/14/20 1047  TempSrc:   PainSc: 0-No pain                 Matheau Orona

## 2020-08-14 NOTE — Discharge Instructions (Signed)
You are being discharged to home.  Resume your previous diet.  We are waiting for your pathology results.  Your physician has recommended a repeat colonoscopy for surveillance based on pathology results.    Colon Polyps  Colon polyps are tissue growths inside the colon, which is part of the large intestine. They are one of the types of polyps that can grow in the body. A polyp may be a round bump or a mushroom-shaped growth. You could have one polyp or more than one. Most colon polyps are noncancerous (benign). However, some colon polyps can become cancerous over time. Finding and removing the polyps early can help prevent this. What are the causes? The exact cause of colon polyps is not known. What increases the risk? The following factors may make you more likely to develop this condition:  Having a family history of colorectal cancer or colon polyps.  Being older than 50 years of age.  Being younger than 50 years of age and having a significant family history of colorectal cancer or colon polyps or a genetic condition that puts you at higher risk of getting colon polyps.  Having inflammatory bowel disease, such as ulcerative colitis or Crohn's disease.  Having certain conditions passed from parent to child (hereditary conditions), such as: ? Familial adenomatous polyposis (FAP). ? Lynch syndrome. ? Turcot syndrome. ? Peutz-Jeghers syndrome. ? MUTYH-associated polyposis (MAP).  Being overweight.  Certain lifestyle factors. These include smoking cigarettes, drinking too much alcohol, not getting enough exercise, and eating a diet that is high in fat and red meat and low in fiber.  Having had childhood cancer that was treated with radiation of the abdomen. What are the signs or symptoms? Many times, there are no symptoms. If you have symptoms, they may include:  Blood coming from the rectum during a bowel movement.  Blood in the stool (feces). The blood may be bright red or  very dark in color.  Pain in the abdomen.  A change in bowel habits, such as constipation or diarrhea. How is this diagnosed? This condition is diagnosed with a colonoscopy. This is a procedure in which a lighted, flexible scope is inserted into the opening between the buttocks (anus) and then passed into the colon to examine the area. Polyps are sometimes found when a colonoscopy is done as part of routine cancer screening tests. How is this treated? This condition is treated by removing any polyps that are found. Most polyps can be removed during a colonoscopy. Those polyps will then be tested for cancer. Additional treatment may be needed depending on the results of testing. Follow these instructions at home: Eating and drinking  Eat foods that are high in fiber, such as fruits, vegetables, and whole grains.  Eat foods that are high in calcium and vitamin D, such as milk, cheese, yogurt, eggs, liver, fish, and broccoli.  Limit foods that are high in fat, such as fried foods and desserts.  Limit the amount of red meat, precooked or cured meat, or other processed meat that you eat, such as hot dogs, sausages, bacon, or meat loaves.  Limit sugary drinks.   Lifestyle  Maintain a healthy weight, or lose weight if recommended by your health care provider.  Exercise every day or as told by your health care provider.  Do not use any products that contain nicotine or tobacco, such as cigarettes, e-cigarettes, and chewing tobacco. If you need help quitting, ask your health care provider.  Do not drink alcohol if: ?   Your health care provider tells you not to drink. ? You are pregnant, may be pregnant, or are planning to become pregnant.  If you drink alcohol: ? Limit how much you use to:  0-1 drink a day for women.  0-2 drinks a day for men. ? Know how much alcohol is in your drink. In the [place] one drink equals one 12 oz bottle of beer (355 mL), one 5 oz glass of wine (148 mL), or one  1 oz glass of hard liquor (44 mL). General instructions  Take over-the-counter and prescription medicines only as told by your health care provider.  Keep all follow-up visits. This is important. This includes having regularly scheduled colonoscopies. Talk to your health care provider about when you need a colonoscopy. Contact a health care provider if:  You have new or worsening bleeding during a bowel movement.  You have new or increased blood in your stool.  You have a change in bowel habits.  You lose weight for no known reason. Summary  Colon polyps are tissue growths inside the colon, which is part of the large intestine. They are one type of polyp that can grow in the body.  Most colon polyps are noncancerous (benign), but some can become cancerous over time.  This condition is diagnosed with a colonoscopy.  This condition is treated by removing any polyps that are found. Most polyps can be removed during a colonoscopy. This information is not intended to replace advice given to you by your health care provider. Make sure you discuss any questions you have with your health care provider. Document Revised: 09/07/2019 Document Reviewed: 09/07/2019 Elsevier Patient Education  2021 Elsevier Inc.  Colonoscopy, Adult, Care After This sheet gives you information about how to care for yourself after your procedure. Your doctor may also give you more specific instructions. If you have problems or questions, call your doctor. What can I expect after the procedure? After the procedure, it is common to have:  A small amount of blood in your poop (stool) for 24 hours.  Some gas.  Mild cramping or bloating in your belly (abdomen). Follow these instructions at home: Eating and drinking  Drink enough fluid to keep your pee (urine) pale yellow.  Follow instructions from your doctor about what you cannot eat or drink.  Return to your normal diet as told by your doctor. Avoid heavy  or fried foods that are hard to digest.   Activity  Rest as told by your doctor.  Do not sit for a long time without moving. Get up to take short walks every 1-2 hours. This is important. Ask for help if you feel weak or unsteady.  Return to your normal activities as told by your doctor. Ask your doctor what activities are safe for you. To help cramping and bloating:  Try walking around.  Put heat on your belly as told by your doctor. Use the heat source that your doctor recommends, such as a moist heat pack or a heating pad. ? Put a towel between your skin and the heat source. ? Leave the heat on for 20-30 minutes. ? Remove the heat if your skin turns bright red. This is very important if you are unable to feel pain, heat, or cold. You may have a greater risk of getting burned.   General instructions  If you were given a medicine to help you relax (sedative) during your procedure, it can affect you for many hours. Do not drive or   use machinery until your doctor says that it is safe.  For the first 24 hours after the procedure: ? Do not sign important documents. ? Do not drink alcohol. ? Do your daily activities more slowly than normal. ? Eat foods that are soft and easy to digest.  Take over-the-counter or prescription medicines only as told by your doctor.  Keep all follow-up visits as told by your doctor. This is important. Contact a doctor if:  You have blood in your poop 2-3 days after the procedure. Get help right away if:  You have more than a small amount of blood in your poop.  You see large clumps of tissue (blood clots) in your poop.  Your belly is swollen.  You feel like you may vomit (nauseous).  You vomit.  You have a fever.  You have belly pain that gets worse, and medicine does not help your pain. Summary  After the procedure, it is common to have a small amount of blood in your poop. You may also have mild cramping and bloating in your belly.  If  you were given a medicine to help you relax (sedative) during your procedure, it can affect you for many hours. Do not drive or use machinery until your doctor says that it is safe.  Get help right away if you have a lot of blood in your poop, feel like you may vomit, have a fever, or have more belly pain. This information is not intended to replace advice given to you by your health care provider. Make sure you discuss any questions you have with your health care provider. Document Revised: 03/25/2019 Document Reviewed: 12/13/2018 Elsevier Patient Education  2021 Elsevier Inc.  

## 2020-08-14 NOTE — Anesthesia Preprocedure Evaluation (Addendum)
Anesthesia Evaluation  Patient identified by MRN, date of birth, ID band Patient awake    Reviewed: Allergy & Precautions, NPO status , Patient's Chart, lab work & pertinent test results  History of Anesthesia Complications Negative for: history of anesthetic complications  Airway Mallampati: II  TM Distance: >3 FB Neck ROM: Full    Dental  (+) Dental Advisory Given, Chipped   Pulmonary neg pulmonary ROS,    Pulmonary exam normal breath sounds clear to auscultation       Cardiovascular Exercise Tolerance: Good hypertension, Pt. on medications Normal cardiovascular exam Rhythm:Regular Rate:Normal     Neuro/Psych negative neurological ROS  negative psych ROS   GI/Hepatic negative GI ROS, Neg liver ROS,   Endo/Other  negative endocrine ROS  Renal/GU negative Renal ROS     Musculoskeletal negative musculoskeletal ROS (+)   Abdominal   Peds  Hematology negative hematology ROS (+)   Anesthesia Other Findings   Reproductive/Obstetrics negative OB ROS                           Anesthesia Physical Anesthesia Plan  ASA: II  Anesthesia Plan: General   Post-op Pain Management:    Induction: Intravenous  PONV Risk Score and Plan: TIVA and Propofol infusion  Airway Management Planned: Nasal Cannula and Natural Airway  Additional Equipment:   Intra-op Plan:   Post-operative Plan:   Informed Consent: I have reviewed the patients History and Physical, chart, labs and discussed the procedure including the risks, benefits and alternatives for the proposed anesthesia with the patient or authorized representative who has indicated his/her understanding and acceptance.     Dental advisory given  Plan Discussed with: CRNA and Surgeon  Anesthesia Plan Comments:         Anesthesia Quick Evaluation

## 2020-08-15 LAB — SURGICAL PATHOLOGY

## 2020-08-16 ENCOUNTER — Encounter (INDEPENDENT_AMBULATORY_CARE_PROVIDER_SITE_OTHER): Payer: Self-pay | Admitting: *Deleted

## 2020-08-21 ENCOUNTER — Encounter (HOSPITAL_COMMUNITY): Payer: Self-pay | Admitting: Gastroenterology

## 2022-01-21 ENCOUNTER — Other Ambulatory Visit: Payer: Self-pay

## 2022-01-21 ENCOUNTER — Ambulatory Visit
Admission: EM | Admit: 2022-01-21 | Discharge: 2022-01-21 | Disposition: A | Discharge status: Home / Self Care | Payer: BC Managed Care – PPO | Attending: Family Medicine | Admitting: Family Medicine

## 2022-01-21 ENCOUNTER — Encounter: Payer: Self-pay | Admitting: Emergency Medicine

## 2022-01-21 DIAGNOSIS — S29011A Strain of muscle and tendon of front wall of thorax, initial encounter: Secondary | ICD-10-CM | POA: Diagnosis not present

## 2022-01-21 MED ORDER — NAPROXEN 500 MG PO TABS: 500.0000 mg | ORAL_TABLET | Freq: Two times a day (BID) | ORAL | 0 refills | Status: AC | PRN

## 2022-01-21 MED ORDER — CYCLOBENZAPRINE HCL 10 MG PO TABS
10.0000 mg | ORAL_TABLET | Freq: Three times a day (TID) | ORAL | 0 refills | Status: AC | PRN
Start: 2022-01-21 — End: ?

## 2022-01-21 NOTE — ED Provider Notes (Signed)
[facility]    CSN: [id number] Arrival date & time: 01/21/22  1741      History   Chief Complaint Chief Complaint  Patient presents with   Arm Pain    HPI Adam Fernandez is a 51 y.o. male.   Presenting today with localized right sided chest pain that is significantly worse with movement of the right arm that started earlier today.  He states he does not recall lifting anything heavy or injuring himself but he does work multiple jobs and very active, Librarian, academic.  He denies numbness, tingling, weakness down the arm but does have some pain radiating down into the shoulder and upper arm with certain movements.  Denies shortness of breath, palpitations, dizziness, history of heart conditions.  Took some aspirin and put IcyHot to the area with minimal relief earlier.    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 Authorizing Provider  cyclobenzaprine (FLEXERIL) 10 MG tablet Take 1 tablet (10 mg total) by mouth 3 (three) times daily as needed for muscle spasms. Do not drink alcohol or drive while taking this medication.  May cause drowsiness. 01/21/22  Yes Volney American, PA-C  naproxen (NAPROSYN) 500 MG tablet Take 1 tablet (500 mg total) by mouth 2 (two) times daily as needed. 01/21/22  Yes Volney American, PA-C  acetaminophen  (TYLENOL) 500 MG tablet Take 500-1,000 mg by mouth every 6 (six) hours as needed (pain).    [provider]  Ascorbic Acid (VITAMIN C PO) Take 1 tablet by mouth daily in the afternoon.    [provider]  Black Currant Seed Oil 500 MG CAPS Take 500 mg by mouth daily.    [provider]  Cholecalciferol (VITAMIN D3 PO) Take 2 tablets by mouth daily in the afternoon.    [provider]  Cinnamon 500 MG TABS Take 500 mg by mouth daily.    [provider]  famotidine (PEPCID) 20 MG tablet Take 20 mg by mouth at bedtime. 03/25/20   [provider]  fluticasone (FLONASE) 50 MCG/ACT nasal spray Place 2 sprays into both nostrils daily. 06/18/20   [provider]  GARLIC PO Take 1 capsule by mouth 2 (two) times a week.    [provider]  lisinopril-hydrochlorothiazide (PRINZIDE,ZESTORETIC) 10-12.5 MG tablet Take 1 tablet by mouth daily. 10/09/17   Caren Macadam, MD  loratadine (CLARITIN) 10 MG tablet TAKE 1 TABLET BY MOUTH EVERY DAY Patient taking differently: Take 10 mg by mouth daily as needed for allergies. 12/07/17   Caren Macadam, MD  Multiple Vitamin (MULTIVITAMIN WITH MINERALS) TABS tablet Take 1 tablet by mouth daily.    [provider]  Multiple Vitamins-Minerals (ZINC PO) Take 1 tablet by mouth daily in the afternoon.    [provider]  Omega-3 Fatty Acids (  FISH OIL) 500 MG CAPS Take 500 mg by mouth every evening.    [provider]    Family History Family History  Problem Relation Age of Onset   Hypertension Father    Cancer Brother        leukemia    Diabetes Paternal Grandmother     Social History Social History   Tobacco Use   Smoking status: Never   Smokeless tobacco: Never  Substance Use Topics   Alcohol use: No   Drug use: No     Allergies   Patient has no known allergies.   Review of Systems Review of Systems Per HPI  Physical Exam Triage Vital Signs ED Triage  Vitals  Enc Vitals Group     BP 01/21/22 1844 (!) 152/91     Pulse Rate 01/21/22 1844 91     Resp 01/21/22 1844 16     Temp 01/21/22 1844 98.2 F (36.8 C)     Temp Source 01/21/22 1844 Oral     SpO2 01/21/22 1844 97 %     Weight --      Height --      Head Circumference --      Peak Flow --      Pain Score 01/21/22 1849 8     Pain Loc --      Pain Edu? --      Excl. in [place]? --    No data found.  Updated Vital Signs BP (!) 152/91 (BP Location: Right Arm)   Pulse 91   Temp 98.2 F (36.8 C) (Oral)   Resp 16   SpO2 97%   Visual Acuity Right Eye Distance:   Left Eye Distance:   Bilateral Distance:    Right Eye Near:   Left Eye Near:    Bilateral Near:     Physical Exam Vitals and nursing note reviewed.  Constitutional:      Appearance: Normal appearance.  HENT:     Head: Atraumatic.     Mouth/Throat:     Mouth: Mucous membranes are moist.  Eyes:     Extraocular Movements: Extraocular movements intact.     Conjunctiva/sclera: Conjunctivae normal.  Cardiovascular:     Rate and Rhythm: Normal rate and regular rhythm.     Heart sounds: Normal heart sounds.  Pulmonary:     Effort: Pulmonary effort is normal.     Breath sounds: Normal breath sounds.  Abdominal:     General: Bowel sounds are normal. There is no distension.     Palpations: Abdomen is soft.     Tenderness: There is no abdominal tenderness. There is no guarding.  Musculoskeletal:        General: Tenderness present. Normal range of motion.     Cervical back: Normal range of motion and neck supple.     Comments: Pain reproducible on palpation of the right pectoral muscle and with range of motion against resistance of the right arm  Skin:    General: Skin is warm and dry.  Neurological:     General: No focal deficit present.     Mental Status: He is oriented to person, place, and time.     Motor: No weakness.     Gait: Gait normal.  Psychiatric:        Mood and Affect: Mood normal.        Thought  Content: Thought content normal.        Judgment: Judgment normal.  UC Treatments / Results  Labs (all labs ordered are listed, but only abnormal results are displayed) Labs Reviewed - No data to display  EKG   Radiology No results found.  Procedures Procedures (including critical care time)  Medications Ordered in UC Medications - No data to display  Initial Impression / Assessment and Plan / UC Course  I have reviewed the triage vital signs and the nursing notes.  Pertinent labs & imaging results that were available during my care of the patient were reviewed by me and considered in my medical decision making (see chart for details).     Strongly suspect pectoral strain causing his pain.  EKG deferred with shared decision making today.  Treat with Flexeril, naproxen, heat, stretches, massage.  Work note given.  Return for any worsening symptoms.  Final Clinical Impressions(s) / UC Diagnoses   Final diagnoses:  Pectoralis muscle strain, initial encounter   Discharge Instructions   None    ED Prescriptions     Medication Sig Dispense Auth. Provider   cyclobenzaprine (FLEXERIL) 10 MG tablet Take 1 tablet (10 mg total) by mouth 3 (three) times daily as needed for muscle spasms. Do not drink alcohol or drive while taking this medication.  May cause drowsiness. 15 tablet Volney American, [place]   naproxen (NAPROSYN) 500 MG tablet Take 1 tablet (500 mg total) by mouth 2 (two) times daily as needed. 30 tablet Volney American, [place]      PDMP not reviewed this encounter.   Volney American, [place] 01/21/22 1919

## 2022-01-21 NOTE — ED Triage Notes (Signed)
Pt reports right sided chest pain that is worse when pt elevates right arm. Pt denies any known injury.
